# Patient Record
Sex: Male | Born: 1975 | Race: Black or African American | Hispanic: No | Smoking: Current every day smoker
Health system: Southern US, Community
[De-identification: ages and names within clinical notes are randomized; demographics above are authoritative.]

## PROBLEM LIST (undated history)

## (undated) DIAGNOSIS — H4920 Sixth [abducent] nerve palsy, unspecified eye: Secondary | ICD-10-CM

## (undated) DIAGNOSIS — F32A Depression, unspecified: Secondary | ICD-10-CM

## (undated) DIAGNOSIS — F419 Anxiety disorder, unspecified: Secondary | ICD-10-CM

## (undated) DIAGNOSIS — I2699 Other pulmonary embolism without acute cor pulmonale: Secondary | ICD-10-CM

## (undated) DIAGNOSIS — G5600 Carpal tunnel syndrome, unspecified upper limb: Secondary | ICD-10-CM

## (undated) HISTORY — PX: FRACTURE SURGERY: SHX138

---

## 2000-07-25 ENCOUNTER — Encounter: Payer: Self-pay | Admitting: Emergency Medicine

## 2000-07-25 ENCOUNTER — Emergency Department (HOSPITAL_COMMUNITY): Admission: EM | Admit: 2000-07-25 | Discharge: 2000-07-25 | Payer: Self-pay | Admitting: Emergency Medicine

## 2002-08-21 ENCOUNTER — Emergency Department (HOSPITAL_COMMUNITY): Admission: EM | Admit: 2002-08-21 | Discharge: 2002-08-22 | Payer: Self-pay | Admitting: Emergency Medicine

## 2002-08-23 ENCOUNTER — Encounter: Payer: Self-pay | Admitting: Emergency Medicine

## 2002-08-23 ENCOUNTER — Emergency Department (HOSPITAL_COMMUNITY): Admission: EM | Admit: 2002-08-23 | Discharge: 2002-08-23 | Payer: Self-pay | Admitting: Emergency Medicine

## 2005-01-06 ENCOUNTER — Emergency Department (HOSPITAL_COMMUNITY): Admission: EM | Admit: 2005-01-06 | Discharge: 2005-01-06 | Payer: Self-pay | Admitting: Emergency Medicine

## 2005-02-11 ENCOUNTER — Emergency Department (HOSPITAL_COMMUNITY): Admission: EM | Admit: 2005-02-11 | Discharge: 2005-02-11 | Payer: Self-pay | Admitting: Emergency Medicine

## 2005-03-24 ENCOUNTER — Emergency Department (HOSPITAL_COMMUNITY): Admission: EM | Admit: 2005-03-24 | Discharge: 2005-03-24 | Payer: Self-pay | Admitting: Emergency Medicine

## 2008-04-17 ENCOUNTER — Emergency Department (HOSPITAL_COMMUNITY): Admission: EM | Admit: 2008-04-17 | Discharge: 2008-04-17 | Payer: Self-pay | Admitting: Emergency Medicine

## 2010-04-24 LAB — CBC
MCHC: 34.5 g/dL (ref 30.0–36.0)
MCV: 90.9 fL (ref 78.0–100.0)
Platelets: 322 10*3/uL (ref 150–400)
RDW: 13.6 % (ref 11.5–15.5)

## 2010-04-24 LAB — BASIC METABOLIC PANEL
BUN: 12 mg/dL (ref 6–23)
CO2: 22 mEq/L (ref 19–32)
Calcium: 8.4 mg/dL (ref 8.4–10.5)
Chloride: 105 mEq/L (ref 96–112)
Creatinine, Ser: 0.89 mg/dL (ref 0.4–1.5)
GFR calc Af Amer: >60 mL/min (ref >60)
GFR calc non Af Amer: >60 mL/min (ref >60)
Glucose, Bld: 99 mg/dL (ref 70–99)
Potassium: 3.2 mEq/L — ABNORMAL LOW (ref 3.5–5.1)
Sodium: 134 mEq/L — ABNORMAL LOW (ref 135–145)

## 2010-04-24 LAB — DIFFERENTIAL
Basophils Relative: 0 % (ref 0–1)
Eosinophils Absolute: 0 10*3/uL (ref 0.0–0.7)
Monocytes Relative: 2 % — ABNORMAL LOW (ref 3–12)
Neutrophils Relative %: 89 % — ABNORMAL HIGH (ref 43–77)

## 2011-05-14 HISTORY — PX: TOOTH EXTRACTION: SUR596

## 2011-06-11 ENCOUNTER — Emergency Department (HOSPITAL_COMMUNITY): Payer: Self-pay

## 2011-06-11 ENCOUNTER — Emergency Department (HOSPITAL_COMMUNITY)
Admission: EM | Admit: 2011-06-11 | Discharge: 2011-06-12 | Disposition: A | Discharge status: Home / Self Care | Payer: Self-pay | Attending: Emergency Medicine | Admitting: Emergency Medicine

## 2011-06-11 ENCOUNTER — Encounter (HOSPITAL_COMMUNITY): Payer: Self-pay | Admitting: *Deleted

## 2011-06-11 DIAGNOSIS — J019 Acute sinusitis, unspecified: Secondary | ICD-10-CM | POA: Insufficient documentation

## 2011-06-11 DIAGNOSIS — R51 Headache: Secondary | ICD-10-CM | POA: Insufficient documentation

## 2011-06-11 DIAGNOSIS — F17200 Nicotine dependence, unspecified, uncomplicated: Secondary | ICD-10-CM | POA: Insufficient documentation

## 2011-06-11 MED ORDER — IBUPROFEN 800 MG PO TABS
800.0000 mg | ORAL_TABLET | Freq: Once | ORAL | Status: AC
Start: 2011-06-11 — End: 2011-06-11
  Administered 2011-06-11: 800 mg via ORAL
  Filled 2011-06-11: qty 1

## 2011-06-11 MED ORDER — HYDROMORPHONE HCL PF 1 MG/ML IJ SOLN
1.0000 mg | Freq: Once | INTRAMUSCULAR | Status: AC
  Administered 2011-06-11: 1 mg via INTRAMUSCULAR
  Filled 2011-06-11: qty 1

## 2011-06-11 MED ORDER — ONDANSETRON 4 MG PO TBDP
4.0000 mg | ORAL_TABLET | Freq: Once | ORAL | Status: AC
  Administered 2011-06-11: 4 mg via ORAL
  Filled 2011-06-11: qty 1

## 2011-06-11 NOTE — ED Provider Notes (Signed)
History     CSN: [id number]  Arrival date & time 06/11/11  2120   First MD Initiated Contact with Patient 06/11/11 2256      Chief Complaint  Patient presents with  . Migraine    (Consider location/radiation/quality/duration/timing/severity/associated sxs/prior treatment) HPI Comments: R temporal headache.  No trauma.  No n/v.  No earache, sore throat or sinus sxs.  No h/o MHA.  Patient is a 36 y.o. male presenting with migraine. The history is provided by the patient. No language interpreter was used.  Migraine This is a new problem. Episode onset: 6 days ago. The problem occurs constantly. The problem has been unchanged. Associated symptoms include headaches. Pertinent negatives include no chills, coughing, fever, rash, sore throat, swollen glands or weakness. The symptoms are aggravated by nothing. He has tried NSAIDs for the symptoms. The treatment provided mild ("dulls the pain but it comes back".) relief.    Past Medical History  Diagnosis Date  . Migraine     History reviewed. No pertinent past surgical history.  History reviewed. No pertinent family history.  History  Substance Use Topics  . Smoking status: Current Everyday Smoker    Types: Cigars  . Smokeless tobacco: Not on file  . Alcohol Use: Yes      Review of Systems  Constitutional: Negative for fever and chills.  HENT: Negative for sore throat and neck stiffness.   Respiratory: Negative for cough.   Skin: Negative for rash.  Neurological: Positive for headaches. Negative for seizures, syncope, facial asymmetry, speech difficulty and weakness.  All other systems reviewed and are negative.    Allergies  Bee venom  Home Medications   Current Outpatient Rx  Name Route Sig Dispense Refill  . IBUPROFEN 200 MG PO TABS Oral Take 800 mg by mouth as needed. For pain    . AMOXICILLIN-POT CLAVULANATE 875-125 MG PO TABS Oral Take 1 tablet by mouth every 12 (twelve) hours. 14 tablet 0  .  HYDROCODONE-ACETAMINOPHEN 5-325 MG PO TABS  One tab po q 4-6 hrs prn pain 20 tablet 0    BP 126/77  Pulse 73  Temp(Src) 97.7 F (36.5 C) (Oral)  Resp 20  Ht 6\' 3"  (1.905 m)  Wt 165 lb (74.844 kg)  BMI 20.62 kg/m2  SpO2 98%  Physical Exam  Nursing note and vitals reviewed. Constitutional: He is oriented to person, place, and time. He appears well-developed and well-nourished.  HENT:  Head: Normocephalic and atraumatic.  Right Ear: Hearing, tympanic membrane, external ear and ear canal normal.  Left Ear: Hearing, tympanic membrane, external ear and ear canal normal.       No sinus pain or PT  Eyes: EOM are normal. Pupils are equal, round, and reactive to light.  Neck: Trachea normal, normal range of motion and phonation normal. No spinous process tenderness and no muscular tenderness present. Carotid bruit is not present. No rigidity. Normal range of motion present.  Cardiovascular: Normal rate, regular rhythm, normal heart sounds and intact distal pulses.   Pulmonary/Chest: Effort normal and breath sounds normal. No respiratory distress.  Abdominal: Soft. He exhibits no distension. There is no tenderness.  Musculoskeletal: Normal range of motion.  Neurological: He is alert and oriented to person, place, and time. He has normal strength. He displays normal reflexes. No cranial nerve deficit or sensory deficit. He displays a negative Romberg sign. Coordination and gait normal. GCS eye subscore is 4. GCS verbal subscore is 5. GCS motor subscore is 6.  Reflex Scores:  Bicep reflexes are 2+ on the right side and 2+ on the left side.      Brachioradialis reflexes are 2+ on the right side and 2+ on the left side.      Patellar reflexes are 2+ on the right side and 2+ on the left side.      Achilles reflexes are 2+ on the right side and 2+ on the left side. Skin: Skin is warm and dry.  Psychiatric: He has a normal mood and affect. Judgment normal.    ED Course  Procedures (including  critical care time)  Labs Reviewed - No data to display Ct Head Wo Contrast  06/11/2011  *RADIOLOGY REPORT*  Clinical Data: Right-sided headache for 6 days  CT HEAD WITHOUT CONTRAST  Technique:  Contiguous axial images were obtained from the base of the skull through the vertex without contrast.  Comparison: None.  Findings: There is no evidence for acute infarction, intracranial hemorrhage, mass lesion, hydrocephalus, or extra-axial fluid. There is no atrophy or white matter disease.  The calvarium is intact.  Mild chronic sinus disease is evident.  There is no acute mastoid fluid.  IMPRESSION: Negative exam.  Original Report Authenticated By: Elsie Stain, M.D.     1. Headache   2. Sinusitis, acute       MDM  No focal neurologic sxs.  Will tx sinusitis.  Pt is to return to ED if sxs change or worsen significantly.        Worthy Rancher, PA 06/12/11 [phone]

## 2011-06-11 NOTE — ED Notes (Signed)
Pt reporting intermittent headache X5 days.  Reports pain worse on right side of head.  Denies any nausea, vomiting, dizziness or visual problems.  Pt reports that he has been taking 800 mg of Ibuprofen 2-3 times per day with minor effectiveness.

## 2011-06-11 NOTE — ED Notes (Signed)
Migraine HA since Friday, has taken Ibuprofen with some relief but returns

## 2011-06-12 MED ORDER — AMOXICILLIN-POT CLAVULANATE 875-125 MG PO TABS: 1.0000 | ORAL_TABLET | Freq: Two times a day (BID) | ORAL | Status: AC

## 2011-06-12 MED ORDER — AMOXICILLIN-POT CLAVULANATE 875-125 MG PO TABS
1.0000 | ORAL_TABLET | Freq: Once | ORAL | Status: AC
  Administered 2011-06-12: 1 via ORAL
  Filled 2011-06-12: qty 1

## 2011-06-12 MED ORDER — HYDROCODONE-ACETAMINOPHEN 5-325 MG PO TABS: ORAL_TABLET | ORAL | Status: DC

## 2011-06-12 NOTE — Discharge Instructions (Signed)
Headaches, Frequently Asked Questions MIGRAINE HEADACHES Q: What is migraine? What causes it? How can I treat it? A: Generally, migraine headaches begin as a dull ache. Then they develop into a constant, throbbing, and pulsating pain. You may experience pain at the temples. You may experience pain at the front or back of one or both sides of the head. The pain is usually accompanied by a combination of:  Nausea.   Vomiting.   Sensitivity to light and noise.  Some people (about 15%) experience an aura (see below) before an attack. The cause of migraine is believed to be chemical reactions in the brain. Treatment for migraine may include over-the-counter or prescription medications. It may also include self-help techniques. These include relaxation training and biofeedback.  Q: What is an aura? A: About 15% of people with migraine get an "aura". This is a sign of neurological symptoms that occur before a migraine headache. You may see wavy or jagged lines, dots, or flashing lights. You might experience tunnel vision or blind spots in one or both eyes. The aura can include visual or auditory hallucinations (something imagined). It may include disruptions in smell (such as strange odors), taste or touch. Other symptoms include:  Numbness.   A "pins and needles" sensation.   Difficulty in recalling or speaking the correct word.  These neurological events may last as long as 60 minutes. These symptoms will fade as the headache begins. Q: What is a trigger? A: Certain physical or environmental factors can lead to or "trigger" a migraine. These include:  Foods.   Hormonal changes.   Weather.   Stress.  It is important to remember that triggers are different for everyone. To help prevent migraine attacks, you need to figure out which triggers affect you. Keep a headache diary. This is a good way to track triggers. The diary will help you talk to your healthcare professional about your  condition. Q: Does weather affect migraines? A: Bright sunshine, hot, humid conditions, and drastic changes in barometric pressure may lead to, or "trigger," a migraine attack in some people. But studies have shown that weather does not act as a trigger for everyone with migraines. Q: What is the link between migraine and hormones? A: Hormones start and regulate many of your body's functions. Hormones keep your body in balance within a constantly changing environment. The levels of hormones in your body are unbalanced at times. Examples are during menstruation, pregnancy, or menopause. That can lead to a migraine attack. In fact, about three quarters of all women with migraine report that their attacks are related to the menstrual cycle.  Q: Is there an increased risk of stroke for migraine sufferers? A: The likelihood of a migraine attack causing a stroke is very remote. That is not to say that migraine sufferers cannot have a stroke associated with their migraines. In persons under age 40, the most common associated factor for stroke is migraine headache. But over the course of a person's normal life span, the occurrence of migraine headache may actually be associated with a reduced risk of dying from cerebrovascular disease due to stroke.  Q: What are acute medications for migraine? A: Acute medications are used to treat the pain of the headache after it has started. Examples over-the-counter medications, NSAIDs, ergots, and triptans.  Q: What are the triptans? A: Triptans are the newest class of abortive medications. They are specifically targeted to treat migraine. Triptans are vasoconstrictors. They moderate some chemical reactions in the brain.   The triptans work on receptors in your brain. Triptans help to restore the balance of a neurotransmitter called serotonin. Fluctuations in levels of serotonin are thought to be a main cause of migraine.  Q: Are over-the-counter medications for migraine  effective? A: Over-the-counter, or "OTC," medications may be effective in relieving mild to moderate pain and associated symptoms of migraine. But you should see your caregiver before beginning any treatment regimen for migraine.  Q: What are preventive medications for migraine? A: Preventive medications for migraine are sometimes referred to as "prophylactic" treatments. They are used to reduce the frequency, severity, and length of migraine attacks. Examples of preventive medications include antiepileptic medications, antidepressants, beta-blockers, calcium channel blockers, and NSAIDs (nonsteroidal anti-inflammatory drugs). Q: Why are anticonvulsants used to treat migraine? A: During the past few years, there has been an increased interest in antiepileptic drugs for the prevention of migraine. They are sometimes referred to as "anticonvulsants". Both epilepsy and migraine may be caused by similar reactions in the brain.  Q: Why are antidepressants used to treat migraine? A: Antidepressants are typically used to treat people with depression. They may reduce migraine frequency by regulating chemical levels, such as serotonin, in the brain.  Q: What alternative therapies are used to treat migraine? A: The term "alternative therapies" is often used to describe treatments considered outside the scope of conventional Western medicine. Examples of alternative therapy include acupuncture, acupressure, and yoga. Another common alternative treatment is herbal therapy. Some herbs are believed to relieve headache pain. Always discuss alternative therapies with your caregiver before proceeding. Some herbal products contain arsenic and other toxins. TENSION HEADACHES Q: What is a tension-type headache? What causes it? How can I treat it? A: Tension-type headaches occur randomly. They are often the result of temporary stress, anxiety, fatigue, or anger. Symptoms include soreness in your temples, a tightening  band-like sensation around your head (a "vice-like" ache). Symptoms can also include a pulling feeling, pressure sensations, and contracting head and neck muscles. The headache begins in your forehead, temples, or the back of your head and neck. Treatment for tension-type headache may include over-the-counter or prescription medications. Treatment may also include self-help techniques such as relaxation training and biofeedback. CLUSTER HEADACHES Q: What is a cluster headache? What causes it? How can I treat it? A: Cluster headache gets its name because the attacks come in groups. The pain arrives with little, if any, warning. It is usually on one side of the head. A tearing or bloodshot eye and a runny nose on the same side of the headache may also accompany the pain. Cluster headaches are believed to be caused by chemical reactions in the brain. They have been described as the most severe and intense of any headache type. Treatment for cluster headache includes prescription medication and oxygen. SINUS HEADACHES Q: What is a sinus headache? What causes it? How can I treat it? A: When a cavity in the bones of the face and skull (a sinus) becomes inflamed, the inflammation will cause localized pain. This condition is usually the result of an allergic reaction, a tumor, or an infection. If your headache is caused by a sinus blockage, such as an infection, you will probably have a fever. An x-ray will confirm a sinus blockage. Your caregiver's treatment might include antibiotics for the infection, as well as antihistamines or decongestants.  REBOUND HEADACHES Q: What is a rebound headache? What causes it? How can I treat it? A: A pattern of taking acute headache medications too   often can lead to a condition known as "rebound headache." A pattern of taking too much headache medication includes taking it more than 2 days per week or in excessive amounts. That means more than the label or a caregiver advises.  With rebound headaches, your medications not only stop relieving pain, they actually begin to cause headaches. Doctors treat rebound headache by tapering the medication that is being overused. Sometimes your caregiver will gradually substitute a different type of treatment or medication. Stopping may be a challenge. Regularly overusing a medication increases the potential for serious side effects. Consult a caregiver if you regularly use headache medications more than 2 days per week or more than the label advises. ADDITIONAL QUESTIONS AND ANSWERS Q: What is biofeedback? A: Biofeedback is a self-help treatment. Biofeedback uses special equipment to monitor your body's involuntary physical responses. Biofeedback monitors:  Breathing.   Pulse.   Heart rate.   Temperature.   Muscle tension.   Brain activity.  Biofeedback helps you refine and perfect your relaxation exercises. You learn to control the physical responses that are related to stress. Once the technique has been mastered, you do not need the equipment any more. Q: Are headaches hereditary? A: Four out of five (80%) of people that suffer report a family history of migraine. Scientists are not sure if this is genetic or a family predisposition. Despite the uncertainty, a child has a 50% chance of having migraine if one parent suffers. The child has a 75% chance if both parents suffer.  Q: Can children get headaches? A: By the time they reach high school, most young people have experienced some type of headache. Many safe and effective approaches or medications can prevent a headache from occurring or stop it after it has begun.  Q: What type of doctor should I see to diagnose and treat my headache? A: Start with your primary caregiver. Discuss his or her experience and approach to headaches. Discuss methods of classification, diagnosis, and treatment. Your caregiver may decide to recommend you to a headache specialist, depending upon  your symptoms or other physical conditions. Having diabetes, allergies, etc., may require a more comprehensive and inclusive approach to your headache. The National [employer] will provide, upon request, a list of [facility] physician members in your state. Document Released: 03/22/2003 Document Revised: 12/19/2010 Document Reviewed: 08/30/2007 [facility] Patient Information 2012 [place], [place].Sinusitis Sinuses are air pockets within the bones of your face. The growth of bacteria within a sinus leads to infection. The infection prevents the sinuses from draining. This infection is called sinusitis. SYMPTOMS  There will be different areas of pain depending on which sinuses have become infected.  The maxillary sinuses often produce pain beneath the eyes.   Frontal sinusitis may cause pain in the middle of the forehead and above the eyes.  Other problems (symptoms) include:  Toothaches.   Colored, pus-like (purulent) drainage from the nose.   Swelling, warmth, and tenderness over the sinus areas may be signs of infection.  TREATMENT  Sinusitis is most often determined by an exam.X-rays may be taken. If x-rays have been taken, make sure you obtain your results or find out how you are to obtain them. Your caregiver may give you medications (antibiotics). These are medications that will help kill the bacteria causing the infection. You may also be given a medication (decongestant) that helps to reduce sinus swelling.  HOME CARE INSTRUCTIONS   Only take over-the-counter or prescription medicines for pain, discomfort, or fever as directed  by your caregiver.   Drink extra fluids. Fluids help thin the mucus so your sinuses can drain more easily.   Applying either moist heat or ice packs to the sinus areas may help relieve discomfort.   Use saline nasal sprays to help moisten your sinuses. The sprays can be found at your local drugstore.  SEEK IMMEDIATE MEDICAL CARE IF:  You have a fever.    You have increasing pain, severe headaches, or toothache.   You have nausea, vomiting, or drowsiness.   You develop unusual swelling around the face or trouble seeing.  MAKE SURE YOU:   Understand these instructions.   Will watch your condition.   Will get help right away if you are not doing well or get worse.  Document Released: 12/30/2004 Document Revised: 12/19/2010 Document Reviewed: 07/29/2006 [facility] Patient Information 2012 [place], [place].   The radiologist is identifying a mild sinusitis.  i can't confirm that this is the underlying cause of your headache but we will treat and see if the headache improves.

## 2011-06-12 NOTE — ED Provider Notes (Signed)
Medical screening examination/treatment/procedure(s) were performed by non-physician practitioner and as supervising physician I was immediately available for consultation/collaboration.   Joya Gaskins, MD 06/12/11 [phone]

## 2011-06-24 ENCOUNTER — Encounter (HOSPITAL_COMMUNITY): Payer: Self-pay | Admitting: *Deleted

## 2011-06-24 ENCOUNTER — Inpatient Hospital Stay (HOSPITAL_COMMUNITY)
Admission: EM | Admit: 2011-06-24 | Discharge: 2011-06-27 | DRG: 123 | Disposition: A | Discharge status: Home / Self Care | Payer: MEDICAID | Attending: Internal Medicine | Admitting: Internal Medicine

## 2011-06-24 ENCOUNTER — Emergency Department (HOSPITAL_COMMUNITY): Payer: Self-pay

## 2011-06-24 DIAGNOSIS — R519 Headache, unspecified: Secondary | ICD-10-CM | POA: Diagnosis present

## 2011-06-24 DIAGNOSIS — F17200 Nicotine dependence, unspecified, uncomplicated: Secondary | ICD-10-CM | POA: Diagnosis present

## 2011-06-24 DIAGNOSIS — H4920 Sixth [abducent] nerve palsy, unspecified eye: Secondary | ICD-10-CM

## 2011-06-24 DIAGNOSIS — G509 Disorder of trigeminal nerve, unspecified: Secondary | ICD-10-CM | POA: Diagnosis present

## 2011-06-24 DIAGNOSIS — R51 Headache: Secondary | ICD-10-CM

## 2011-06-24 LAB — COMPREHENSIVE METABOLIC PANEL
ALT: 8 U/L (ref 0–53)
AST: 13 U/L (ref 0–37)
Albumin: 3.8 g/dL (ref 3.5–5.2)
Alkaline Phosphatase: 114 U/L (ref 39–117)
Glucose, Bld: 123 mg/dL — ABNORMAL HIGH (ref 70–99)
Potassium: 3.9 mEq/L (ref 3.5–5.1)
Sodium: 139 mEq/L (ref 135–145)
Total Protein: 7.7 g/dL (ref 6.0–8.3)

## 2011-06-24 LAB — DIFFERENTIAL
Eosinophils Absolute: 0.2 10*3/uL (ref 0.0–0.7)
Lymphs Abs: 2.2 10*3/uL (ref 0.7–4.0)
Neutrophils Relative %: 53 % (ref 43–77)

## 2011-06-24 LAB — CBC
MCH: 31.3 pg (ref 26.0–34.0)
Platelets: 327 10*3/uL (ref 150–400)
RBC: 5.02 MIL/uL (ref 4.22–5.81)
WBC: 5.8 10*3/uL (ref 4.0–10.5)

## 2011-06-24 NOTE — ED Provider Notes (Cosign Needed)
History     CSN: [id number]  Arrival date & time 06/24/11  1859   First MD Initiated Contact with Patient 06/24/11 2214      Chief Complaint  Patient presents with  . Eye Problem    (Consider location/radiation/quality/duration/timing/severity/associated sxs/prior treatment) The history is provided by the patient. No language interpreter was used.   Colton Sanders is a 36 y.o. male who presents to the Emergency Department complaining of double vision especially when he looks straight ahead and to the right onset 6 days ago with associated numbness on the right side of the face and HA.  Patient says that he has been experiencing numbness and headaches for 2 weeks. Patient describes his HA pain as throbbing and pulsating. Patient said he was taking Ibuprofen for his headache, but it would only dull the HA. Patient said he was seen here in the ED for his headache and was prescribed Hydrocodone.  Patient also had a tooth extraction recently and was told his facial pain and numbness was from the abscessed tooth and his been on antibiotics. He denies fever, numbness or weakness in his extremities.  Patient is a current everyday smoker. Patient says he smokes 2-3 Black and Mild cigarettes every day. Patient says he drinks 2-3 beers every week. 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 Patient has a family history of HTN and diabetes (mother).  PCP none  Past Medical History  Diagnosis Date  . Migraine     History reviewed. No pertinent past surgical history.  History reviewed. No pertinent family history. HTN AODM  History  Substance Use Topics  . Smoking status: Current Everyday Smoker    Types: Cigars  . Smokeless tobacco: Not on file  . Alcohol Use: Yes   unemployed   Review of Systems A complete 10 system review of systems was obtained and all systems are negative except as noted in the HPI and PMH.   Allergies  Bee venom  Home  Medications   Current Outpatient Rx  Name Route Sig Dispense Refill  . IBUPROFEN 200 MG PO TABS Oral Take 800 mg by mouth as needed. For pain    . PENICILLIN V POTASSIUM 500 MG PO TABS Oral Take 500 mg by mouth 4 (four) times daily.      BP 128/79  Pulse 58  Temp(Src) 98.1 F (36.7 C) (Oral)  Resp 18  Ht 6\' 3"  (1.905 m)  Wt 165 lb (74.844 kg)  BMI 20.62 kg/m2  SpO2 100%  Vital signs normal    Physical Exam  Nursing note and vitals reviewed. Constitutional: He is oriented to person, place, and time. He appears well-developed and well-nourished.  HENT:  Head: Normocephalic and atraumatic.  Right Ear: External ear normal.  Left Ear: External ear normal.  Nose: Nose normal.  Mouth/Throat: Oropharynx is clear and moist.  Eyes: Conjunctivae and EOM are normal. Pupils are equal, round, and reactive to light. Pupils are equal.       Right 6th nerve palsy.  Neck: Normal range of motion. Neck supple.  Cardiovascular: Normal rate and regular rhythm.   Pulmonary/Chest: Effort normal and breath sounds normal.  Abdominal: Soft. Bowel sounds are normal.  Musculoskeletal: Normal range of motion.  Neurological: He is alert and oriented to person, place, and time. No cranial nerve deficit.       No focal weakness noted  Skin: Skin is warm and dry.  Psychiatric: He has a normal mood and  affect. His behavior is normal.    ED Course  Procedures (including critical care time) DIAGNOSTIC STUDIES: Oxygen Saturation is 100% on room air, normal by my interpretation.    COORDINATION OF CARE: 10:37PM- Patient informed of current plan for treatment and evaluation and agrees with plan at this time. Will order blood work and consult with a neurologist.   23:21 D/W Dr Marjory Lies, Neurology, feels needs repeat head CT since prior one done before his palsy and admit for MR and further evaluation  23:31 Dr Rito Ehrlich will admit.   Results for orders placed during the hospital encounter of 06/24/11    CBC      Component Value Range   WBC 5.8  4.0 - 10.5 (K/uL)   RBC 5.02  4.22 - 5.81 (MIL/uL)   Hemoglobin 15.7  13.0 - 17.0 (g/dL)   HCT 40.9  81.1 - 91.4 (%)   MCV 92.8  78.0 - 100.0 (fL)   MCH 31.3  26.0 - 34.0 (pg)   MCHC 33.7  30.0 - 36.0 (g/dL)   RDW 78.2  95.6 - 21.3 (%)   Platelets 327  150 - 400 (K/uL)  DIFFERENTIAL      Component Value Range   Neutrophils Relative 53  43 - 77 (%)   Neutro Abs 3.1  1.7 - 7.7 (K/uL)   Lymphocytes Relative 37  12 - 46 (%)   Lymphs Abs 2.2  0.7 - 4.0 (K/uL)   Monocytes Relative 6  3 - 12 (%)   Monocytes Absolute 0.3  0.1 - 1.0 (K/uL)   Eosinophils Relative 3  0 - 5 (%)   Eosinophils Absolute 0.2  0.0 - 0.7 (K/uL)   Basophils Relative 1  0 - 1 (%)   Basophils Absolute 0.1  0.0 - 0.1 (K/uL)  COMPREHENSIVE METABOLIC PANEL      Component Value Range   Sodium 139  135 - 145 (mEq/L)   Potassium 3.9  3.5 - 5.1 (mEq/L)   Chloride 101  96 - 112 (mEq/L)   CO2 29  19 - 32 (mEq/L)   Glucose, Bld 123 (*) 70 - 99 (mg/dL)   BUN 11  6 - 23 (mg/dL)   Creatinine, Ser 0.86  0.50 - 1.35 (mg/dL)   Calcium 57.8  8.4 - 10.5 (mg/dL)   Total Protein 7.7  6.0 - 8.3 (g/dL)   Albumin 3.8  3.5 - 5.2 (g/dL)   AST 13  0 - 37 (U/L)   ALT 8  0 - 53 (U/L)   Alkaline Phosphatase 114  39 - 117 (U/L)   Total Bilirubin 0.3  0.3 - 1.2 (mg/dL)   GFR calc non Af Amer >90  >90 (mL/min)   GFR calc Af Amer >90  >90 (mL/min)  APTT      Component Value Range   aPTT 27  24 - 37 (seconds)  PROTIME-INR      Component Value Range   Prothrombin Time 13.2  11.6 - 15.2 (seconds)   INR 0.98  0.00 - 1.49    Laboratory interpretation all normal   Ct Head Wo Contrast  06/11/2011  *RADIOLOGY REPORT*  Clinical Data: Right-sided headache for 6 days  CT HEAD WITHOUT CONTRAST  Technique:  Contiguous axial images were obtained from the base of the skull through the vertex without contrast.  Comparison: None.  Findings: There is no evidence for acute infarction, intracranial hemorrhage,  mass lesion, hydrocephalus, or extra-axial fluid. There is no atrophy or white matter disease.  The  calvarium is intact.  Mild chronic sinus disease is evident.  There is no acute mastoid fluid.  IMPRESSION: Negative exam.  Original Report Authenticated By: Elsie Stain, M.D.    Ct Head Wo Contrast  06/25/2011  *RADIOLOGY REPORT*  Clinical Data: New right facial nerve palsy.  CT HEAD WITHOUT CONTRAST  Technique:  Contiguous axial images were obtained from the base of the skull through the vertex without contrast.  Comparison: 06/11/2011  Findings: There is no evidence for acute hemorrhage, hydrocephalus, mass lesion, or abnormal extra-axial fluid collection.  No definite CT evidence for acute infarction.  The visualized paranasal sinuses and mastoid air cells are predominately clear.  Right maxillary sinus mucous retention cyst versus polyp and minimal ethmoid air cell opacification.  IMPRESSION: No acute intracranial abnormality.  Original Report Authenticated By: Waneta Martins, M.D.      1. Sixth cranial nerve palsy    Plan admission   MDM   I personally performed the services described in this documentation, which was scribed in my presence. The recorded information has been reviewed and considered.  Devoria Albe, MD, FACEP    Ward Givens, MD 06/24/11 2356  Ward Givens, MD 06/25/11 7846  Ward Givens, MD 06/25/11 0145

## 2011-06-24 NOTE — ED Notes (Signed)
Blurred vision, seeing double for over 1 week.  Seen here for headache.  And thought was sinus infection . Has had tooth extraction.  Cont to have blurred vision. And headache.No appetite, No nausea.

## 2011-06-25 ENCOUNTER — Encounter (HOSPITAL_COMMUNITY): Payer: Self-pay | Admitting: Internal Medicine

## 2011-06-25 ENCOUNTER — Inpatient Hospital Stay (HOSPITAL_COMMUNITY): Payer: Self-pay

## 2011-06-25 DIAGNOSIS — R51 Headache: Secondary | ICD-10-CM

## 2011-06-25 DIAGNOSIS — H4920 Sixth [abducent] nerve palsy, unspecified eye: Secondary | ICD-10-CM

## 2011-06-25 DIAGNOSIS — R519 Headache, unspecified: Secondary | ICD-10-CM | POA: Diagnosis present

## 2011-06-25 DIAGNOSIS — G509 Disorder of trigeminal nerve, unspecified: Secondary | ICD-10-CM | POA: Diagnosis present

## 2011-06-25 LAB — COMPREHENSIVE METABOLIC PANEL
ALT: 6 U/L (ref 0–53)
AST: 12 U/L (ref 0–37)
Albumin: 3.3 g/dL — ABNORMAL LOW (ref 3.5–5.2)
CO2: 26 mEq/L (ref 19–32)
Calcium: 9.4 mg/dL (ref 8.4–10.5)
Creatinine, Ser: 0.84 mg/dL (ref 0.50–1.35)
GFR calc non Af Amer: >90 mL/min (ref >90)
Sodium: 138 mEq/L (ref 135–145)
Total Protein: 6.9 g/dL (ref 6.0–8.3)

## 2011-06-25 LAB — SEDIMENTATION RATE: Sed Rate: 5 mm/hr (ref 0–16)

## 2011-06-25 LAB — CBC
HCT: 44.7 % (ref 39.0–52.0)
Hemoglobin: 15 g/dL (ref 13.0–17.0)
MCH: 30.9 pg (ref 26.0–34.0)
MCHC: 33.6 g/dL (ref 30.0–36.0)
MCV: 92.2 fL (ref 78.0–100.0)
RBC: 4.85 MIL/uL (ref 4.22–5.81)

## 2011-06-25 LAB — RAPID URINE DRUG SCREEN, HOSP PERFORMED
Barbiturates: NOT DETECTED
Benzodiazepines: NOT DETECTED
Cocaine: NOT DETECTED
Tetrahydrocannabinol: NOT DETECTED

## 2011-06-25 LAB — HOMOCYSTEINE: Homocysteine: 8.6 umol/L (ref 4.0–15.4)

## 2011-06-25 LAB — POCT I-STAT TROPONIN I: Troponin i, poc: 0 ng/mL (ref 0.00–0.08)

## 2011-06-25 LAB — TSH: TSH: 0.44 u[IU]/mL (ref 0.350–4.500)

## 2011-06-25 MED ORDER — GADOBENATE DIMEGLUMINE 529 MG/ML IV SOLN
15.0000 mL | Freq: Once | INTRAVENOUS | Status: AC | PRN
  Administered 2011-06-25: 15 mL via INTRAVENOUS

## 2011-06-25 MED ORDER — ALBUTEROL SULFATE (5 MG/ML) 0.5% IN NEBU: 2.5000 mg | INHALATION_SOLUTION | RESPIRATORY_TRACT | Status: DC | PRN

## 2011-06-25 MED ORDER — SODIUM CHLORIDE 0.9 % IJ SOLN
3.0000 mL | Freq: Two times a day (BID) | INTRAMUSCULAR | Status: DC
  Administered 2011-06-25 – 2011-06-26 (×4): 3 mL via INTRAVENOUS
  Filled 2011-06-25 (×5): qty 3

## 2011-06-25 MED ORDER — ONDANSETRON HCL 4 MG/2ML IJ SOLN: 4.0000 mg | Freq: Four times a day (QID) | INTRAMUSCULAR | Status: DC | PRN

## 2011-06-25 MED ORDER — ASPIRIN EC 325 MG PO TBEC
325.0000 mg | DELAYED_RELEASE_TABLET | Freq: Every day | ORAL | Status: DC
  Administered 2011-06-25: 325 mg via ORAL
  Filled 2011-06-25: qty 1

## 2011-06-25 MED ORDER — ACETAMINOPHEN 650 MG RE SUPP: 650.0000 mg | Freq: Four times a day (QID) | RECTAL | Status: DC | PRN

## 2011-06-25 MED ORDER — PENICILLIN V POTASSIUM 250 MG PO TABS
500.0000 mg | ORAL_TABLET | Freq: Four times a day (QID) | ORAL | Status: AC
  Administered 2011-06-25 – 2011-06-26 (×8): 500 mg via ORAL
  Filled 2011-06-25 (×8): qty 2

## 2011-06-25 MED ORDER — ACETAMINOPHEN 325 MG PO TABS
ORAL_TABLET | ORAL | Status: AC
  Administered 2011-06-25: 650 mg
  Filled 2011-06-25: qty 2

## 2011-06-25 MED ORDER — HYDROCODONE-ACETAMINOPHEN 5-325 MG PO TABS
1.0000 | ORAL_TABLET | Freq: Once | ORAL | Status: AC
  Administered 2011-06-25: 1 via ORAL
  Filled 2011-06-25: qty 1

## 2011-06-25 MED ORDER — ONDANSETRON HCL 4 MG PO TABS: 4.0000 mg | ORAL_TABLET | Freq: Four times a day (QID) | ORAL | Status: DC | PRN

## 2011-06-25 MED ORDER — ENOXAPARIN SODIUM 40 MG/0.4ML ~~LOC~~ SOLN
40.0000 mg | SUBCUTANEOUS | Status: DC
Start: 2011-06-25 — End: 2011-06-25
  Administered 2011-06-25: 40 mg via SUBCUTANEOUS
  Filled 2011-06-25: qty 0.4

## 2011-06-25 MED ORDER — ACETAMINOPHEN 325 MG PO TABS
650.0000 mg | ORAL_TABLET | Freq: Four times a day (QID) | ORAL | Status: DC | PRN
  Administered 2011-06-25: 650 mg via ORAL
  Filled 2011-06-25: qty 2

## 2011-06-25 MED ORDER — HYDROCODONE-ACETAMINOPHEN 5-325 MG PO TABS
1.0000 | ORAL_TABLET | Freq: Four times a day (QID) | ORAL | Status: DC | PRN
  Administered 2011-06-25 – 2011-06-27 (×6): 1 via ORAL
  Filled 2011-06-25 (×6): qty 1

## 2011-06-25 MED ORDER — DOCUSATE SODIUM 100 MG PO CAPS
100.0000 mg | ORAL_CAPSULE | Freq: Two times a day (BID) | ORAL | Status: DC
  Administered 2011-06-25: 100 mg via ORAL
  Filled 2011-06-25 (×4): qty 1

## 2011-06-25 NOTE — Consult Note (Signed)
Reason for Consult: Referring Physician:   MOLLY MASELLI is an 36 y.o. male.  HPI:   Past Medical History  Diagnosis Date  . Migraine     Past Surgical History  Procedure Date  . Tooth extraction 05/2011    History reviewed. No pertinent family history.  Social History:  reports that he has been smoking Cigars.  He does not have any smokeless tobacco history on file. He reports that he drinks about 1.8 ounces of alcohol per week. He reports that he does not use illicit drugs.  Allergies:  Allergies  Allergen Reactions  . Bee Venom Hives and Swelling    Medications:  Prior to Admission medications   Medication Sig Start Date End Date Taking? Authorizing Provider  ibuprofen (ADVIL,MOTRIN) 200 MG tablet Take 800 mg by mouth as needed. For pain   Yes Historical Provider, MD  penicillin v potassium (VEETID) 500 MG tablet Take 500 mg by mouth 4 (four) times daily. 06/19/11 06/25/11 Yes Historical Provider, MD   Scheduled Meds:   . acetaminophen      . aspirin EC  325 mg Oral Daily  . docusate sodium  100 mg Oral BID  . enoxaparin  40 mg Subcutaneous Q24H  . HYDROcodone-acetaminophen  1 tablet Oral Once  . penicillin v potassium  500 mg Oral QID  . sodium chloride  3 mL Intravenous Q12H   Continuous Infusions:  PRN Meds:.acetaminophen, acetaminophen, albuterol, ondansetron (ZOFRAN) IV, ondansetron   Results for orders placed during the hospital encounter of 06/24/11 (from the past 48 hour(s))  CBC     Status: Normal   Collection Time   06/24/11 10:49 PM      Component Value Range Comment   WBC 5.8  4.0 - 10.5 K/uL    RBC 5.02  4.22 - 5.81 MIL/uL    Hemoglobin 15.7  13.0 - 17.0 g/dL    HCT 16.1  09.6 - 04.5 %    MCV 92.8  78.0 - 100.0 fL    MCH 31.3  26.0 - 34.0 pg    MCHC 33.7  30.0 - 36.0 g/dL    RDW 40.9  81.1 - 91.4 %    Platelets 327  150 - 400 K/uL   DIFFERENTIAL     Status: Normal   Collection Time   06/24/11 10:49 PM      Component Value Range Comment   Neutrophils Relative 53  43 - 77 %    Neutro Abs 3.1  1.7 - 7.7 K/uL    Lymphocytes Relative 37  12 - 46 %    Lymphs Abs 2.2  0.7 - 4.0 K/uL    Monocytes Relative 6  3 - 12 %    Monocytes Absolute 0.3  0.1 - 1.0 K/uL    Eosinophils Relative 3  0 - 5 %    Eosinophils Absolute 0.2  0.0 - 0.7 K/uL    Basophils Relative 1  0 - 1 %    Basophils Absolute 0.1  0.0 - 0.1 K/uL   COMPREHENSIVE METABOLIC PANEL     Status: Abnormal   Collection Time   06/24/11 10:49 PM      Component Value Range Comment   Sodium 139  135 - 145 mEq/L    Potassium 3.9  3.5 - 5.1 mEq/L    Chloride 101  96 - 112 mEq/L    CO2 29  19 - 32 mEq/L    Glucose, Bld 123 (*) 70 - 99 mg/dL  BUN 11  6 - 23 mg/dL    Creatinine, Ser 4.54  0.50 - 1.35 mg/dL    Calcium 09.8  8.4 - 10.5 mg/dL    Total Protein 7.7  6.0 - 8.3 g/dL    Albumin 3.8  3.5 - 5.2 g/dL    AST 13  0 - 37 U/L    ALT 8  0 - 53 U/L    Alkaline Phosphatase 114  39 - 117 U/L    Total Bilirubin 0.3  0.3 - 1.2 mg/dL    GFR calc non Af Amer >90  >90 mL/min    GFR calc Af Amer >90  >90 mL/min   APTT     Status: Normal   Collection Time   06/24/11 10:49 PM      Component Value Range Comment   aPTT 27  24 - 37 seconds   PROTIME-INR     Status: Normal   Collection Time   06/24/11 10:49 PM      Component Value Range Comment   Prothrombin Time 13.2  11.6 - 15.2 seconds    INR 0.98  0.00 - 1.49   POCT I-STAT TROPONIN I     Status: Normal   Collection Time   06/24/11 11:12 PM      Component Value Range Comment   Troponin i, poc 0.00  0.00 - 0.08 ng/mL    Comment 3            URINE RAPID DRUG SCREEN (HOSP PERFORMED)     Status: Abnormal   Collection Time   06/25/11 12:35 AM      Component Value Range Comment   Opiates NONE DETECTED  NONE DETECTED    Cocaine NONE DETECTED  NONE DETECTED    Benzodiazepines NONE DETECTED  NONE DETECTED    Amphetamines POSITIVE (*) NONE DETECTED RESULT REPEATED AND VERIFIED   Tetrahydrocannabinol NONE DETECTED  NONE DETECTED      Barbiturates NONE DETECTED  NONE DETECTED   COMPREHENSIVE METABOLIC PANEL     Status: Abnormal   Collection Time   06/25/11  6:26 AM      Component Value Range Comment   Sodium 138  135 - 145 mEq/L    Potassium 3.8  3.5 - 5.1 mEq/L    Chloride 103  96 - 112 mEq/L    CO2 26  19 - 32 mEq/L    Glucose, Bld 97  70 - 99 mg/dL    BUN 10  6 - 23 mg/dL    Creatinine, Ser 1.19  0.50 - 1.35 mg/dL    Calcium 9.4  8.4 - 14.7 mg/dL    Total Protein 6.9  6.0 - 8.3 g/dL    Albumin 3.3 (*) 3.5 - 5.2 g/dL    AST 12  0 - 37 U/L    ALT 6  0 - 53 U/L    Alkaline Phosphatase 101  39 - 117 U/L    Total Bilirubin 0.3  0.3 - 1.2 mg/dL    GFR calc non Af Amer >90  >90 mL/min    GFR calc Af Amer >90  >90 mL/min   CBC     Status: Normal   Collection Time   06/25/11  6:26 AM      Component Value Range Comment   WBC 5.2  4.0 - 10.5 K/uL    RBC 4.85  4.22 - 5.81 MIL/uL    Hemoglobin 15.0  13.0 - 17.0 g/dL    HCT 44.7  39.0 - 52.0 %    MCV 92.2  78.0 - 100.0 fL    MCH 30.9  26.0 - 34.0 pg    MCHC 33.6  30.0 - 36.0 g/dL    RDW 14.7  82.9 - 56.2 %    Platelets 346  150 - 400 K/uL     Ct Head Wo Contrast  06/25/2011  *RADIOLOGY REPORT*  Clinical Data: New right facial nerve palsy.  CT HEAD WITHOUT CONTRAST  Technique:  Contiguous axial images were obtained from the base of the skull through the vertex without contrast.  Comparison: 06/11/2011  Findings: There is no evidence for acute hemorrhage, hydrocephalus, mass lesion, or abnormal extra-axial fluid collection.  No definite CT evidence for acute infarction.  The visualized paranasal sinuses and mastoid air cells are predominately clear.  Right maxillary sinus mucous retention cyst versus polyp and minimal ethmoid air cell opacification.  IMPRESSION: No acute intracranial abnormality.  Original Report Authenticated By: Waneta Martins, M.D.    Review of Systems  Constitutional: Negative.   Eyes: Negative.   Respiratory: Negative.   Cardiovascular:  Negative.   Gastrointestinal: Negative.   Genitourinary: Negative.   Musculoskeletal: Negative.   Skin: Negative.   Neurological: Positive for headaches.   Blood pressure 119/72, pulse 53, temperature 98.2 F (36.8 C), temperature source Oral, resp. rate 16, height 6\' 3"  (1.905 m), weight 74.8 kg (164 lb 14.5 oz), SpO2 97.00%. Physical Exam  Assessment/Plan: See dict  Nayeli Calvert 06/25/2011, 9:40 AM

## 2011-06-25 NOTE — Progress Notes (Signed)
Chart reviewed. Discussed with Dr. Gerilyn Pilgrim  Subjective: No changes  Objective: Vital signs in last 24 hours: Filed Vitals:   06/25/11 0237 06/25/11 0238 06/25/11 0315 06/25/11 0525  BP:   120/80 119/72  Pulse:   54 53  Temp: 98.1 F (36.7 C) 98.1 F (36.7 C) 97.7 F (36.5 C) 98.2 F (36.8 C)  TempSrc:   Oral Oral  Resp:   16 16  Height:   6\' 3"  (1.905 m)   Weight:   74.8 kg (164 lb 14.5 oz)   SpO2:   98% 97%   Weight change:   Intake/Output Summary (Last 24 hours) at 06/25/11 1314 Last data filed at 06/25/11 0900  Gross per 24 hour  Intake      0 ml  Output      0 ml  Net      0 ml   Neuro : No numbness involving the right for head. Right eye unable to pass midline laterally Lungs clear to auscultation bilaterally without wheeze rhonchi or rales Cardiovascular regular rate rhythm without murmurs gallops rubs  Lab Results: Basic Metabolic Panel:  Lab 06/25/11 5409 06/24/11 2249  NA 138 139  K 3.8 3.9  CL 103 101  CO2 26 29  GLUCOSE 97 123*  BUN 10 11  CREATININE 0.84 0.94  CALCIUM 9.4 10.0  MG -- --  PHOS -- --   Liver Function Tests:  Lab 06/25/11 0626 06/24/11 2249  AST 12 13  ALT 6 8  ALKPHOS 101 114  BILITOT 0.3 0.3  PROT 6.9 7.7  ALBUMIN 3.3* 3.8   No results found for this basename: LIPASE:2,AMYLASE:2 in the last 168 hours No results found for this basename: AMMONIA:2 in the last 168 hours CBC:  Lab 06/25/11 0626 06/24/11 2249  WBC 5.2 5.8  NEUTROABS -- 3.1  HGB 15.0 15.7  HCT 44.7 46.6  MCV 92.2 92.8  PLT 346 327   Cardiac Enzymes: No results found for this basename: CKTOTAL:3,CKMB:3,CKMBINDEX:3,TROPONINI:3 in the last 168 hours BNP: No results found for this basename: PROBNP:3 in the last 168 hours D-Dimer: No results found for this basename: DDIMER:2 in the last 168 hours CBG: No results found for this basename: GLUCAP:6 in the last 168 hours Hemoglobin A1C: No results found for this basename: HGBA1C in the last 168  hours Fasting Lipid Panel: No results found for this basename: CHOL,HDL,LDLCALC,TRIG,CHOLHDL,LDLDIRECT in the last 811 hours Thyroid Function Tests: No results found for this basename: TSH,T4TOTAL,FREET4,T3FREE,THYROIDAB in the last 168 hours Coagulation:  Lab 06/24/11 2249  LABPROT 13.2  INR 0.98   Anemia Panel: No results found for this basename: VITAMINB12,FOLATE,FERRITIN,TIBC,IRON,RETICCTPCT in the last 168 hours Urine Drug Screen: Drugs of Abuse     Component Value Date/Time   LABOPIA NONE DETECTED 06/25/2011 0035   COCAINSCRNUR NONE DETECTED 06/25/2011 0035   LABBENZ NONE DETECTED 06/25/2011 0035   AMPHETMU POSITIVE* 06/25/2011 0035   THCU NONE DETECTED 06/25/2011 0035   LABBARB NONE DETECTED 06/25/2011 0035   Micro Results: No results found for this or any previous visit (from the past 240 hour(s)). Studies/Results: Ct Head Wo Contrast  06/25/2011  *RADIOLOGY REPORT*  Clinical Data: New right facial nerve palsy.  CT HEAD WITHOUT CONTRAST  Technique:  Contiguous axial images were obtained from the base of the skull through the vertex without contrast.  Comparison: 06/11/2011  Findings: There is no evidence for acute hemorrhage, hydrocephalus, mass lesion, or abnormal extra-axial fluid collection.  No definite CT evidence for acute infarction.  The visualized paranasal sinuses and mastoid air cells are predominately clear.  Right maxillary sinus mucous retention cyst versus polyp and minimal ethmoid air cell opacification.  IMPRESSION: No acute intracranial abnormality.  Original Report Authenticated By: Waneta Martins, M.D.   Mr [name] Wo Contrast  06/25/2011  *RADIOLOGY REPORT*  Clinical Data: Blurred vision.  Double vision.  Headache.  MRI HEAD WITHOUT AND WITH CONTRAST  Technique:  Multiplanar, multiecho pulse sequences of the brain and surrounding structures were obtained according to standard protocol without and with intravenous contrast  Contrast: 15mL MULTIHANCE GADOBENATE  DIMEGLUMINE 529 MG/ML IV SOLN  Comparison: Head CT 06/25/2011  Findings: The brain has a normal appearance on all pulse sequences without evidence of malformation, atrophy, old or acute infarction, mass lesion, hemorrhage, hydrocephalus or extra-axial collection. No evidence of demyelinating disease.  No pituitary mass.  There is minimal mucosal thickening within the right maxillary sinus.  No skull or skull base lesion. The patient has asymmetric fat in the petrous apex on the right, finding that I think  is incidental in this case.  IMPRESSION: Normal appearance of the brain.  Minimal mucosal thickening of the right maxillary sinus.  Original Report Authenticated By: Thomasenia Sales, M.D.   Scheduled Meds:   . acetaminophen      . docusate sodium  100 mg Oral BID  . HYDROcodone-acetaminophen  1 tablet Oral Once  . penicillin v potassium  500 mg Oral QID  . sodium chloride  3 mL Intravenous Q12H  . DISCONTD: aspirin EC  325 mg Oral Daily  . 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 Will order lumbar puncture. Will order the usual studies as well as VDRL, cryptococcal antigen, AFB. Patient has already received Lovenox today. Will stop it and also stop aspirin. Discontinue telemetry. Patient denies recreational drug use, but did not confront him about positive urine drug screen in front of his family member.   LOS: 1 day   Colton Sanders 06/25/2011, 1:14 PM

## 2011-06-25 NOTE — H&P (Addendum)
Colton Sanders is an 36 y.o. male.    PCP: Gentry Fitz  Chief Complaint: Headache and blurred vision  HPI: This is a 36 year old, African American male, who was in his usual state of health about 2 weeks ago, when he first started noticing that he had a headache. He, subsequently, had a tooth extraction last Thursday. The headache improved some, but persisted around his right eye socket. He felt numbness around the right side of the face. And, then about a week ago, he started noticing that he was having blurred vision and double vision. When he would cover either of his eyes he could see normally. The headache is not severe at this time. He denies any fever or chills. Denies any seizure activity. No focal deficits except for the numbness and perceived weakness in the right side of the face. He's never had similar symptoms in the past. Patient presented to the emergency department on May 29 with complaints of headache and was treated for sinusitis.   Home Medications: Prior to Admission medications   Medication Sig Start Date End Date Taking? Authorizing Provider  ibuprofen (ADVIL,MOTRIN) 200 MG tablet Take 800 mg by mouth as needed. For pain   Yes Historical Provider, MD  penicillin v potassium (VEETID) 500 MG tablet Take 500 mg by mouth 4 (four) times daily. 06/19/11 06/25/11 Yes Historical Provider, MD    Allergies:  Allergies  Allergen Reactions  . Bee Venom Hives and Swelling    Past Medical History: Past Medical History  Diagnosis Date  . Migraine     Past Surgical History  Procedure Date  . Tooth extraction 05/2011    Social History:  reports that he has been smoking Cigars.  He does not have any smokeless tobacco history on file. He reports that he drinks alcohol. He reports that he does not use illicit drugs. he drinks a 40 ounce of beer about 2-3 times a week   Family History: History of, diabetes, and hypertension in the family  Review of Systems - History obtained  from the patient General ROS: negative Psychological ROS: negative Ophthalmic ROS: as in hpi ENT ROS: negative Allergy and Immunology ROS: negative Hematological and Lymphatic ROS: negative Endocrine ROS: negative Respiratory ROS: no cough, shortness of breath, or wheezing Cardiovascular ROS: no chest pain or dyspnea on exertion Gastrointestinal ROS: no abdominal pain, change in bowel habits, or black or bloody stools Genito-Urinary ROS: no dysuria, trouble voiding, or hematuria Musculoskeletal ROS: negative Neurological ROS: as in hpi Dermatological ROS: negative  Physical Examination Blood pressure 128/79, pulse 58, temperature 98.1 F (36.7 C), temperature source Oral, resp. rate 18, height 6\' 3"  (1.905 m), weight 74.844 kg (165 lb), SpO2 100.00%.  General appearance: alert, cooperative, appears stated age and no distress Head: Normocephalic, without obvious abnormality, atraumatic Eyes: conjunctivae/corneas clear. PERRL. Marland Kitchen He was unable to laterally rotate his right eye Throat: lips, mucosa, and tongue normal; teeth and gums normal Neck: no adenopathy, no carotid bruit, no JVD, supple, symmetrical, trachea midline and thyroid not enlarged, symmetric, no tenderness/mass/nodules Back: symmetric, no curvature. ROM normal. No CVA tenderness. Resp: clear to auscultation bilaterally Cardio: regular rate and rhythm, S1, S2 normal, no murmur, click, rub or gallop GI: soft, non-tender; bowel sounds normal; no masses,  no organomegaly Extremities: extremities normal, atraumatic, no cyanosis or edema Pulses: 2+ and symmetric Skin: Skin color, texture, turgor normal. No rashes or lesions Lymph nodes: Cervical, supraclavicular, and axillary nodes normal. Neurologic: He is alert and oriented x3.  He does have some sensory deficits in the right side of his face. More so around the right eye. 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 Otherwise, there no other focal neurological deficits  appreciated  Laboratory Data: Results for orders placed during the hospital encounter of 06/24/11 (from the past 48 hour(s))  CBC     Status: Normal   Collection Time   06/24/11 10:49 PM      Component Value Range Comment   WBC 5.8  4.0 - 10.5 (K/uL)    RBC 5.02  4.22 - 5.81 (MIL/uL)    Hemoglobin 15.7  13.0 - 17.0 (g/dL)    HCT 45.4  09.8 - 11.9 (%)    MCV 92.8  78.0 - 100.0 (fL)    MCH 31.3  26.0 - 34.0 (pg)    MCHC 33.7  30.0 - 36.0 (g/dL)    RDW 14.7  82.9 - 56.2 (%)    Platelets 327  150 - 400 (K/uL)   DIFFERENTIAL     Status: Normal   Collection Time   06/24/11 10:49 PM      Component Value Range Comment   Neutrophils Relative 53  43 - 77 (%)    Neutro Abs 3.1  1.7 - 7.7 (K/uL)    Lymphocytes Relative 37  12 - 46 (%)    Lymphs Abs 2.2  0.7 - 4.0 (K/uL)    Monocytes Relative 6  3 - 12 (%)    Monocytes Absolute 0.3  0.1 - 1.0 (K/uL)    Eosinophils Relative 3  0 - 5 (%)    Eosinophils Absolute 0.2  0.0 - 0.7 (K/uL)    Basophils Relative 1  0 - 1 (%)    Basophils Absolute 0.1  0.0 - 0.1 (K/uL)   COMPREHENSIVE METABOLIC PANEL     Status: Abnormal   Collection Time   06/24/11 10:49 PM      Component Value Range Comment   Sodium 139  135 - 145 (mEq/L)    Potassium 3.9  3.5 - 5.1 (mEq/L)    Chloride 101  96 - 112 (mEq/L)    CO2 29  19 - 32 (mEq/L)    Glucose, Bld 123 (*) 70 - 99 (mg/dL)    BUN 11  6 - 23 (mg/dL)    Creatinine, Ser 1.30  0.50 - 1.35 (mg/dL)    Calcium 86.5  8.4 - 10.5 (mg/dL)    Total Protein 7.7  6.0 - 8.3 (g/dL)    Albumin 3.8  3.5 - 5.2 (g/dL)    AST 13  0 - 37 (U/L)    ALT 8  0 - 53 (U/L)    Alkaline Phosphatase 114  39 - 117 (U/L)    Total Bilirubin 0.3  0.3 - 1.2 (mg/dL)    GFR calc non Af Amer >90  >90 (mL/min)    GFR calc Af Amer >90  >90 (mL/min)   APTT     Status: Normal   Collection Time   06/24/11 10:49 PM      Component Value Range Comment   aPTT 27  24 - 37 (seconds)   PROTIME-INR     Status: Normal   Collection Time   06/24/11 10:49 PM       Component Value Range Comment   Prothrombin Time 13.2  11.6 - 15.2 (seconds)    INR 0.98  0.00 - 1.49      Radiology Reports: No results found.  Electrocardiogram: pending  Assessment/Plan  Principal Problem:  *  6th nerve palsy Active Problems:  Headache   #1 6th Nerve Palsy, and headache: Etiology remains unclear although it could be related to his recent tooth extraction. Myasthenia gravis is also in the differential. He will require MRI of his brain. The case was discussed by the ED physician with the neurologist. EKG will be done. Urine drug screen will be checked. Neurology will be consulted. Depending on the results of the MRI further workup may be required.  #2 recent tooth extraction: Continue with his penicillin for 2 more days to complete the course.  DVT prophylaxis will be initiated.  He is a full code.  Further management decisions will depend on results of further testing and patient's response to treatment.  [facility]  [facility] Pager [phone]  06/25/2011, 12:15 AM

## 2011-06-25 NOTE — Consult Note (Signed)
Colton Sanders, Colton Sanders             ACCOUNT NO.:  [id number]  MEDICAL RECORD NO.:  [id number]  LOCATION:  A329                          FACILITY:  [facility]  PHYSICIAN:  Versie Soave A. Gerilyn Pilgrim, M.D. DATE OF BIRTH:  April 24, 1975  DATE OF CONSULTATION: DATE OF DISCHARGE:                                CONSULTATION   REASON FOR CONSULTATION:  Diplopia and headache.  HISTORY OF PRESENT ILLNESS:  The patient is a 36 year old black male, who presents with about 2-week history of headache involving the right frontal area, mostly involving the V1 distribution. He also has had numbness in the same distribution.  The patient was seen in emergency room couple times for his symptoms, in fact he has had 2 head CT scans. He was told that he may have had tooth abscess and in fact got that extracted, however, his symptoms persisted and in fact got worse.  He reports developing blurry of his vision and diplopia which got progressively worse and again this needs medical attention.  He was noted to have a right cranial nerve VII neuropathy and hence was admitted for further evaluation.  The patient does not report symptoms involving the extremities.  There is no focal weakness involving the upper or lower extremities.  No numbness.  The patient does endorse having some tick bites from time to time as he does a lot of outdoor activities, but indicates no bugs were on him for more than 24 hours.  PHYSICAL EXAMINATION:  GENERAL:  Shows a thin pleasant man.  He is in no acute distress. HEENT EVALUATION:  Neck is supple.  Head is normocephalic, atraumatic. ABDOMEN:  Soft. EXTREMITIES:  No edema. NEUROLOGIC:  Mentation:  He is awake and alert.  He is lucid and coherent.  Speech is normal.  Language and cognition also intact. Cranial nerve evaluation shows clear weakness of the lateral rectus muscle on the right side.  Other eye movements are intact, however, including visual eye movements.  Facial muscle strength is  symmetric and normal.  He has reduced sensation of V1 distribution on the right side. Tongue is midline.  Uvula midline.  Shoulder shrug is normal.  Motor examination shows normal tone, bulk, and strength.  I see no pronator drift.  Sensation is normal in the upper and lower extremities. Coordination shows no dysmetria.  There is no past-pointing, no tremors. Reflexes are normal.  Plantars are both downgoing.  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  Differential is wide including demyelinating lesion/multiple sclerosis.  Ischemia is possibility but unlikely given his age and lack of risk factors, other possibilities include inflammatory and infectious diseases such as sarcoidosis and vasculitis. The patient has an MRI with and without contrast pending.  Additional blood tests will be done including angiotensin-converting enzymes, sed rate, ANA, RPR, and thyroid function test.  The patient will likely need to have a spinal tap once the MRI is obtained.  We will continue follow the patient.     Brande Uncapher A. Gerilyn Pilgrim, M.D.     KAD/MEDQ  D:  06/25/2011  T:  06/25/2011  Job:  [id number]

## 2011-06-25 NOTE — Care Management Note (Unsigned)
    Page 1 of 1   06/25/2011     1:04:26 PM   CARE MANAGEMENT NOTE 06/25/2011  Patient:  Colton Sanders, Colton Sanders   Account Number:  [id number]  Date Initiated:  06/25/2011  Documentation initiated by:  Rosemary Holms  Subjective/Objective Assessment:   Pt admitted from home where he lives with his parents. Independent. Self Pay and has no PCP. C/O seeing double.     Action/Plan:   Spoke to pt at bedside. No HH needs identified. CM advised pt that [facility] was taking new patients on a sliding scale pay system and recommended him getting established with a PCP. CM will assist if PCP needed at DC   Anticipated DC Date:  06/26/2011   Anticipated DC Plan:  HOME/SELF CARE  In-house referral  Financial Counselor      DC Planning Services  CM consult      Choice offered to / List presented to:             Status of service:  In process, will continue to follow Medicare Important Message given?   (If response is "NO", the following Medicare IM given date fields will be blank) Date Medicare IM given:   Date Additional Medicare IM given:    Discharge Disposition:    Per UR Regulation:    If discussed at Long Length of Stay Meetings, dates discussed:    Comments:  06/25/11 1200 Sabre Romberger Leanord Hawking RN BSN CM

## 2011-06-25 NOTE — Progress Notes (Signed)
Utilization Review Complete  

## 2011-06-26 ENCOUNTER — Inpatient Hospital Stay (HOSPITAL_COMMUNITY): Payer: Self-pay

## 2011-06-26 DIAGNOSIS — R51 Headache: Secondary | ICD-10-CM

## 2011-06-26 DIAGNOSIS — H4920 Sixth [abducent] nerve palsy, unspecified eye: Secondary | ICD-10-CM

## 2011-06-26 LAB — ANA: Anti Nuclear Antibody(ANA): NEGATIVE

## 2011-06-26 LAB — PROTEIN AND GLUCOSE, CSF
Glucose, CSF: 65 mg/dL (ref 43–76)
Total  Protein, CSF: 26 mg/dL (ref 15–45)

## 2011-06-26 LAB — CSF CELL COUNT WITH DIFFERENTIAL: WBC, CSF: 4 /mm3 (ref 0–5)

## 2011-06-26 MED ORDER — PREDNISONE 20 MG PO TABS
50.0000 mg | ORAL_TABLET | Freq: Two times a day (BID) | ORAL | Status: DC
  Administered 2011-06-26: 50 mg via ORAL
  Filled 2011-06-26: qty 1
  Filled 2011-06-26: qty 2

## 2011-06-26 NOTE — Procedures (Signed)
Preprocedure Dx: Multiple cranial nerve palsies Postprocedure Dx: Multiple cranial nerve palsies Procedure:  Fluoroscopically guided lumbar puncture Radiologist:  Tyron Russell Anesthesia:  2.5 ml of 1% lidocaine Specimen:  8 ml CSF, clear & colorless EBL:   None Opening pressure: 10 cm H2O No immediate complications Complications: **

## 2011-06-26 NOTE — Progress Notes (Signed)
Subjective: Feels better with eye patch on. No change in his symptoms except his headache is slightly improved.  Objective: Vital signs in last 24 hours: Filed Vitals:   06/25/11 1427 06/25/11 2140 06/26/11 0619 06/26/11 1211  BP: 115/76 125/76 122/78 119/74  Pulse: 78 60 62 56  Temp: 97.8 F (36.6 C) 98.2 F (36.8 C) 98.2 F (36.8 C)   TempSrc:  Oral Oral Oral  Resp: 16 18 18 16   Height:      Weight:      SpO2: 96% 100% 98% 100%   Weight change:   Intake/Output Summary (Last 24 hours) at 06/26/11 1424 Last data filed at 06/26/11 0800  Gross per 24 hour  Intake    360 ml  Output      0 ml  Net    360 ml   Neuro : Eye patch in place. Lungs clear to auscultation bilaterally without wheeze rhonchi or rales Cardiovascular regular rate rhythm without murmurs gallops rubs  Lab Results: Basic Metabolic Panel:  Lab 06/25/11 9562 06/24/11 2249  NA 138 139  K 3.8 3.9  CL 103 101  CO2 26 29  GLUCOSE 97 123*  BUN 10 11  CREATININE 0.84 0.94  CALCIUM 9.4 10.0  MG -- --  PHOS -- --   Liver Function Tests:  Lab 06/25/11 0626 06/24/11 2249  AST 12 13  ALT 6 8  ALKPHOS 101 114  BILITOT 0.3 0.3  PROT 6.9 7.7  ALBUMIN 3.3* 3.8   No results found for this basename: LIPASE:2,AMYLASE:2 in the last 168 hours No results found for this basename: AMMONIA:2 in the last 168 hours CBC:  Lab 06/25/11 0626 06/24/11 2249  WBC 5.2 5.8  NEUTROABS -- 3.1  HGB 15.0 15.7  HCT 44.7 46.6  MCV 92.2 92.8  PLT 346 327   Cardiac Enzymes: No results found for this basename: CKTOTAL:3,CKMB:3,CKMBINDEX:3,TROPONINI:3 in the last 168 hours BNP: No results found for this basename: PROBNP:3 in the last 168 hours D-Dimer: No results found for this basename: DDIMER:2 in the last 168 hours CBG: No results found for this basename: GLUCAP:6 in the last 168 hours Hemoglobin A1C: No results found for this basename: HGBA1C in the last 168 hours Fasting Lipid Panel: No results found for this  basename: CHOL,HDL,LDLCALC,TRIG,CHOLHDL,LDLDIRECT in the last 130 hours Thyroid Function Tests:  Lab 06/25/11 1038  TSH 0.440  T4TOTAL --  FREET4 --  T3FREE --  THYROIDAB --   Coagulation:  Lab 06/24/11 2249  LABPROT 13.2  INR 0.98   Anemia Panel:  Lab 06/25/11 1038  VITAMINB12 508  FOLATE --  FERRITIN --  TIBC --  IRON --  RETICCTPCT --   Urine Drug Screen: Drugs of Abuse     Component Value Date/Time   LABOPIA NONE DETECTED 06/25/2011 0035   COCAINSCRNUR NONE DETECTED 06/25/2011 0035   LABBENZ NONE DETECTED 06/25/2011 0035   AMPHETMU POSITIVE* 06/25/2011 0035   THCU NONE DETECTED 06/25/2011 0035   LABBARB NONE DETECTED 06/25/2011 0035   Micro Results: No results found for this or any previous visit (from the past 240 hour(s)). Studies/Results: Ct Head Wo Contrast  06/25/2011  *RADIOLOGY REPORT*  Clinical Data: New right facial nerve palsy.  CT HEAD WITHOUT CONTRAST  Technique:  Contiguous axial images were obtained from the base of the skull through the vertex without contrast.  Comparison: 06/11/2011  Findings: There is no evidence for acute hemorrhage, hydrocephalus, mass lesion, or abnormal extra-axial fluid collection.  No definite CT evidence  for acute infarction.  The visualized paranasal sinuses and mastoid air cells are predominately clear.  Right maxillary sinus mucous retention cyst versus polyp and minimal ethmoid air cell opacification.  IMPRESSION: No acute intracranial abnormality.  Original Report Authenticated By: Waneta Martins, M.D.   Mr [name] Wo Contrast  06/25/2011  *RADIOLOGY REPORT*  Clinical Data: Blurred vision.  Double vision.  Headache.  MRI HEAD WITHOUT AND WITH CONTRAST  Technique:  Multiplanar, multiecho pulse sequences of the brain and surrounding structures were obtained according to standard protocol without and with intravenous contrast  Contrast: 15mL MULTIHANCE GADOBENATE DIMEGLUMINE 529 MG/ML IV SOLN  Comparison: Head CT 06/25/2011   Findings: The brain has a normal appearance on all pulse sequences without evidence of malformation, atrophy, old or acute infarction, mass lesion, hemorrhage, hydrocephalus or extra-axial collection. No evidence of demyelinating disease.  No pituitary mass.  There is minimal mucosal thickening within the right maxillary sinus.  No skull or skull base lesion. The patient has asymmetric fat in the petrous apex on the right, finding that I think  is incidental in this case.  IMPRESSION: Normal appearance of the brain.  Minimal mucosal thickening of the right maxillary sinus.  Original Report Authenticated By: Thomasenia Sales, M.D.   Dg Fluoro Guide Lumbar Puncture  06/26/2011  *RADIOLOGY REPORT*  Clinical Data: Headaches, multiple cranial nerve palsies  FLUOROSCOPIC GUIDED LUMBAR PUNCTURE:  Technique: Written informed consent for fluoroscopic-guided lumbar puncture was obtained. Patient was placed prone. L4-L5 disc space was localized under fluoroscopy. Skin prepped and draped in usual sterile fashion. Skin and soft tissues anesthetized with 1.25 ml of 1% lidocaine. 22 gauge spinal needle advanced into spinal canal. A tiny amount of CSF was seen within the needle hub but CSF flow could not be established.  Skin at the L3-L4 disc space was then localized under fluoroscopy. Additional local anesthesia utilizing 1.25 ml of 1% lidocaine. 22 gauge spinal needle advanced into the spinal canal. Again, a tiny amount of CSF was seen within the needle but no CSF flow could be established despite tilting the table. Position was assessed with a lateral view and the needle was slightly repositioned. Clear colorless CSF was then encountered with opening pressure of 10 cm H2O. 8 ml of CSF was obtained in four tubes for requested laboratory analysis. Procedure tolerated well by patient without immediate complication.  Fluoroscopy time: 1.2 minutes  IMPRESSION: Fluoroscopic guided lumbar puncture as above.  Original Report  Authenticated By: Lollie Marrow, M.D.   Scheduled Meds:    . docusate sodium  100 mg Oral BID  . penicillin v potassium  500 mg Oral QID  . sodium chloride  3 mL Intravenous Q12H   Continuous Infusions:  PRN Meds:.acetaminophen, acetaminophen, albuterol, HYDROcodone-acetaminophen, ondansetron (ZOFRAN) IV, ondansetron Assessment/Plan: Principal Problem:  *6th nerve palsy Active Problems:  5th nerve palsy  Headache  urine drug screen positive for amphetamines  CSF shows normal glucose and protein. Cell count and other studies are currently pending. If no signs of infection, steroids may be tried per neurology.   LOS: 2 days   Alga Southall L 06/26/2011, 2:24 PM

## 2011-06-26 NOTE — Progress Notes (Signed)
NAMEITHAN, TOUHEY             ACCOUNT NO.:  [id number]  MEDICAL RECORD NO.:  [id number]  LOCATION:  A329                          FACILITY:  [facility]  PHYSICIAN:  Lowana Hable A. Gerilyn Pilgrim, M.D. DATE OF BIRTH:  [birth year]  DATE OF PROCEDURE: DATE OF DISCHARGE:                                PROGRESS NOTE   The patient is doing about the same.  He complains of pain involving the right eye along with diplopia.  Still has numbness in V1 distribution on the right side.  No other complaints are reported.  No focal neurological problem.  Today, he is awake and alert.  He is lucid and coherent.  Speech, language, and cognition intact.  Neck is supple. Head is normocephalic, atraumatic.  The patient has pupils about 4-5 mm and briskly reactive bilaterally.  Visual fields are intact. Extraocular movements again shows plegia of the lateral rectus muscle on the right.  Vertically, the eye movements are good.  Again, he continued to have reduced sensation in V1 distribution on the right side.  Facial muscle strength is actually normal including the orbicularis oculi muscles bilaterally.  He has good strength in upper extremities.  MRI and was in abnormal.  Laboratory evaluation so far has been normal.  I do not see an angiotensin-converting enzyme, although it was ordered. However other labs were unremarkable.  RPR nonreactive.  TSH 0.44, lower end of the normal range.  Vitamin B12 of 508.  Homocystine 8.6 and sed rate of 5.  ASSESSMENT AND PLAN:  Cranial neuropathies, unclear etiology.  The patient will be set up to have a spinal tap test done today.  We will also arrange for the patient to have an angiotensin-converting enzymes done.  Once the spinal tap test is done and is unremarkable, we will consider empiric treatment with high-dose steroids.     Sedonia Kitner A. Gerilyn Pilgrim, M.D.     KAD/MEDQ  D:  06/26/2011  T:  06/26/2011  Job:  [id number]

## 2011-06-26 NOTE — Progress Notes (Signed)
LP results ok so far.  Will start prednisone 50 bid. 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

## 2011-06-26 NOTE — Progress Notes (Signed)
Subjective: Interval History:  Objective: Vital signs in last 24 hours: Temp:  [97.8 F (36.6 C)-98.2 F (36.8 C)] 98.2 F (36.8 C) (06/13 0619) Pulse Rate:  [60-78] 62  (06/13 0619) Resp:  [16-18] 18  (06/13 0619) BP: (115-125)/(76-78) 122/78 mmHg (06/13 0619) SpO2:  [96 %-100 %] 98 % (06/13 0619)  Intake/Output from previous day:   Intake/Output this shift:   Nutritional status: General   Lab Results:  [facility] 06/25/11 0626 06/24/11 2249  WBC 5.2 5.8  HGB 15.0 15.7  HCT 44.7 46.6  PLT 346 327  NA 138 139  K 3.8 3.9  CL 103 101  CO2 26 29  GLUCOSE 97 123*  BUN 10 11  CREATININE 0.84 0.94  CALCIUM 9.4 10.0  LABA1C -- --   Lipid Panel No results found for this basename: CHOL,TRIG,HDL,CHOLHDL,VLDL,LDLCALC in the last 72 hours  Studies/Results: Ct Head Wo Contrast  06/25/2011  *RADIOLOGY REPORT*  Clinical Data: New right facial nerve palsy.  CT HEAD WITHOUT CONTRAST  Technique:  Contiguous axial images were obtained from the base of the skull through the vertex without contrast.  Comparison: 06/11/2011  Findings: There is no evidence for acute hemorrhage, hydrocephalus, mass lesion, or abnormal extra-axial fluid collection.  No definite CT evidence for acute infarction.  The visualized paranasal sinuses and mastoid air cells are predominately clear.  Right maxillary sinus mucous retention cyst versus polyp and minimal ethmoid air cell opacification.  IMPRESSION: No acute intracranial abnormality.  Original Report Authenticated By: Waneta Martins, M.D.   Mr [name] Wo Contrast  06/25/2011  *RADIOLOGY REPORT*  Clinical Data: Blurred vision.  Double vision.  Headache.  MRI HEAD WITHOUT AND WITH CONTRAST  Technique:  Multiplanar, multiecho pulse sequences of the brain and surrounding structures were obtained according to standard protocol without and with intravenous contrast  Contrast: 15mL MULTIHANCE GADOBENATE DIMEGLUMINE 529 MG/ML IV SOLN  Comparison: Head CT 06/25/2011   Findings: The brain has a normal appearance on all pulse sequences without evidence of malformation, atrophy, old or acute infarction, mass lesion, hemorrhage, hydrocephalus or extra-axial collection. No evidence of demyelinating disease.  No pituitary mass.  There is minimal mucosal thickening within the right maxillary sinus.  No skull or skull base lesion. The patient has asymmetric fat in the petrous apex on the right, finding that I think  is incidental in this case.  IMPRESSION: Normal appearance of the brain.  Minimal mucosal thickening of the right maxillary sinus.  Original Report Authenticated By: Thomasenia Sales, M.D.    Medications:  Scheduled Meds:   . docusate sodium  100 mg Oral BID  . penicillin v potassium  500 mg Oral QID  . sodium chloride  3 mL Intravenous Q12H  . DISCONTD: aspirin EC  325 mg Oral Daily  . DISCONTD: enoxaparin  40 mg Subcutaneous Q24H   Continuous Infusions:  PRN Meds:.acetaminophen, acetaminophen, albuterol, gadobenate dimeglumine, HYDROcodone-acetaminophen, ondansetron (ZOFRAN) IV, ondansetron   Assessment/Plan: See dict   LOS: 2 days   Colton Sanders

## 2011-06-27 DIAGNOSIS — R51 Headache: Secondary | ICD-10-CM

## 2011-06-27 DIAGNOSIS — H4920 Sixth [abducent] nerve palsy, unspecified eye: Secondary | ICD-10-CM

## 2011-06-27 LAB — VDRL, CSF: VDRL Quant, CSF: NONREACTIVE

## 2011-06-27 MED ORDER — PREDNISONE 20 MG PO TABS: 60.0000 mg | ORAL_TABLET | Freq: Every day | ORAL | Status: DC

## 2011-06-27 MED ORDER — SODIUM CHLORIDE 0.9 % IV SOLN
1000.0000 mg | Freq: Every day | INTRAVENOUS | Status: DC
  Administered 2011-06-27: 1000 mg via INTRAVENOUS
  Filled 2011-06-27 (×4): qty 8

## 2011-06-27 MED ORDER — HYDROCODONE-ACETAMINOPHEN 5-325 MG PO TABS: 1.0000 | ORAL_TABLET | Freq: Four times a day (QID) | ORAL | Status: AC | PRN

## 2011-06-27 MED ORDER — PREDNISONE 10 MG PO TABS: ORAL_TABLET | ORAL | Status: DC

## 2011-06-27 NOTE — Discharge Summary (Signed)
Physician Discharge Summary  Patient ID: MAL ASHER MRN: [id number] DOB/AGE: 1975/02/20 36 y.o.  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      ibuprofen 200 MG tablet   Commonly known as: ADVIL,MOTRIN   Take 800 mg by mouth as needed. For pain      predniSONE 10 MG tablet   Commonly known as: DELTASONE   Take 5 tablets twice per day in the morning and evening for 2 days, then 6 tablets once daily for 2 days, then 5 tablets once daily for 2 days, then 4 tablets once daily for 2 days, then 3 tablets daily for 2 days, then 2 tablets daily for 2 days, then 1 tablet daily for 2 days            Discharge Orders    Future Orders Please Complete By Expires   Activity as tolerated - No restrictions         Follow-up Information    Follow up with DOONQUAH,KOFI, MD in 2 weeks. (Please call office to schedule appointment)    Contact information:   [address] [place] [address] [phone]         Disposition: 01-Home or Self Care  Discharged Condition: stable  Consults: Treatment Team:  Beryle Beams, MD Radiology  Labs:    Sodium  138       Potassium  3.8       Chloride  103       CO2  26       BUN  10       Creatinine, Ser  0.84       Calcium  9.4       GFR calc non Af Amer  90 mL/min">90       GFR calc Af Amer  90 mL/min  The eGFR has been calculated using the CKD EPI  equation. This calculation has not been validated in all clinical situations. eGFR's persistently <90 mL/min signify possible Chronic Kidney Disease.">9090 mL/min  The eGFR has been calculated using the CKD EPI equation. This calculation has not been validated in all clinical situations. eGFR's persistently <90 mL/min signify possible Chronic Kidney Disease." border=0 src="[email or web address]:/[email or web address]" width=5 height=10       Glucose, Bld  97       Alkaline Phosphatase  101       Albumin  3.3       AST  12       ALT  6       Total Protein  6.9       Total Bilirubin  0.3        CARDIAC PROFILE    Troponin i, poc  0.00       Angio Convert Enzyme    4      OTHER CHEM    Vitamin  B-12   508       AMINO ACIDS    Homocysteine-Norm   8.6       CBC    WBC  5.2       RBC  4.85       Hemoglobin  15.0       HCT  44.7       MCV  92.2       MCH  30.9       MCHC  33.6       RDW  14.8       Platelets  346        DIFFERENTIAL    Neutrophils Relative  53       Lymphocytes Relative  37       Monocytes Relative  6       Eosinophils Relative  3       Basophils Relative  1       Neutro Abs  3.1       Lymphs Abs  2.2       Monocytes Absolute  0.3       Eosinophils Absolute  0.2       Basophils Absolute  0.1        OTHER HEMATOLOGY    Sed Rate   5       PROTIME W/ INR    Prothrombin Time  13.2       INR  0.98        PTT    aPTT  27        DIABETES    Glucose, Bld  97        THYROID    TSH   0.440       AUTOIMMUNE    ANA   NEGATIVE       BACTERIAL TESTS    VDRL Quant, CSF    NON REACTIVE     RPR   NON REACTIVE       FUNGAL TESTS    Crypto Ag    NEGATIVE     Cryptococcal Ag Titer    NOT INDICATED      SPINAL FLUID    Glucose, CSF    65     Total Protein, CSF    26     RBC Count, CSF    1     WBC, CSF    4     Segmented Neutrophils-CSF    TOO FEW TO COUNT, SMEAR AVAILABLE FOR REVIEW     Appearance, CSF     CLEAR     Color, CSF    COLORLESS     Supernatant    NOT INDICATED     Tube #    4      TOX, URINE    Amphetamines  POSITIVE        Barbiturates  NONE DETECTED        Benzodiazepines  NONE DETECTED       Opiates  NONE DETECTED       Cocaine  NONE DETECTED       Tetrahydrocannabinol  NONE DETECTED         Diagnostics:  Ct Head Wo Contrast  06/25/2011  *RADIOLOGY REPORT*  Clinical Data: New right facial nerve palsy.  CT HEAD WITHOUT CONTRAST  Technique:  Contiguous axial images were obtained from the base of the skull through the vertex without contrast.  Comparison: 06/11/2011  Findings: There is no evidence for acute hemorrhage,  hydrocephalus, mass lesion, or abnormal extra-axial fluid collection.  No definite CT evidence for acute infarction.  The visualized paranasal sinuses and mastoid air cells are predominately clear.  Right maxillary sinus mucous retention cyst versus polyp and minimal ethmoid air cell opacification.  IMPRESSION: No acute intracranial abnormality.  Original Report Authenticated By: Waneta Martins, M.D.   Ct Head Wo Contrast  06/11/2011  *RADIOLOGY REPORT*  Clinical Data: Right-sided headache for 6 days  CT HEAD WITHOUT CONTRAST  Technique:  Contiguous axial images were obtained from the base of the skull through the vertex without contrast.  Comparison: None.  Findings: There is no evidence for acute infarction, intracranial hemorrhage, mass lesion, hydrocephalus, or extra-axial fluid. There is no atrophy or white matter disease.  The calvarium is intact.  Mild chronic sinus disease is evident.  There is no acute mastoid fluid.  IMPRESSION: Negative exam.  Original Report Authenticated By: Elsie Stain, M.D.   Mr [name] Wo Contrast  06/25/2011  *RADIOLOGY REPORT*  Clinical Data: Blurred vision.  Double vision.  Headache.  MRI HEAD WITHOUT AND WITH CONTRAST  Technique:  Multiplanar, multiecho pulse sequences of the brain and surrounding structures were  obtained according to standard protocol without and with intravenous contrast  Contrast: 15mL MULTIHANCE GADOBENATE DIMEGLUMINE 529 MG/ML IV SOLN  Comparison: Head CT 06/25/2011  Findings: The brain has a normal appearance on all pulse sequences without evidence of malformation, atrophy, old or acute infarction, mass lesion, hemorrhage, hydrocephalus or extra-axial collection. No evidence of demyelinating disease.  No pituitary mass.  There is minimal mucosal thickening within the right maxillary sinus.  No skull or skull base lesion. The patient has asymmetric fat in the petrous apex on the right, finding that I think  is incidental in this case.  IMPRESSION: Normal appearance of the brain.  Minimal mucosal thickening of the right maxillary sinus.  Original Report Authenticated By: Thomasenia Sales, M.D.   Dg Fluoro Guide Lumbar Puncture  06/26/2011  *RADIOLOGY REPORT*  Clinical Data: Headaches, multiple cranial nerve palsies  FLUOROSCOPIC GUIDED LUMBAR PUNCTURE:  Technique: Written informed consent for fluoroscopic-guided lumbar puncture was obtained. Patient was placed prone. L4-L5 disc space was localized under fluoroscopy. Skin prepped and draped in usual sterile fashion. Skin and soft tissues anesthetized with 1.25 ml of 1% lidocaine. 22 gauge spinal needle advanced into spinal canal. A tiny amount of CSF was seen within the needle hub but CSF flow could not be established.  Skin at the L3-L4 disc space was then localized under fluoroscopy. Additional local anesthesia utilizing 1.25 ml of 1% lidocaine. 22 gauge spinal needle advanced into the spinal canal. Again, a tiny amount of CSF was seen within the needle but no CSF flow could be established despite tilting the table. Position was assessed with a lateral view and the needle was slightly repositioned. Clear colorless CSF was then encountered with opening pressure of 10 cm H2O. 8 ml of CSF was obtained in four tubes for requested laboratory analysis.  Procedure tolerated well by patient without immediate complication.  Fluoroscopy time: 1.2 minutes  IMPRESSION: Fluoroscopic guided lumbar puncture as above.  Original Report Authenticated By: Lollie Marrow, M.D.   Procedures: LP  Full Code   Hospital Course: See H&P for complete admission details. Mr. Day is a pleasant 36 year old unassigned black male who presented with diplopia and right periorbital headache. He has had a recent tooth extraction. The headache started prior to the procedure. He thinks the visual changes may  have occurred then as well, but worsened recently. In the emergency room, he was noted to have a sixth cranial nerve palsy on the right. He also had evidence of fifth cranial nerve dysfunction. CAT scan and MRI of the brain were negative. Dr. Gerilyn Pilgrim was consulted and recommended lumbar puncture and if no evidence of infection, high-dose steroids. His LP showed nothing concerning. He was given a gram of Solu-Medrol and sent home on prednisone taper. He will followup with Dr. Gerilyn Pilgrim as an outpatient. Wearing an eye patch has helped. Total time on the day of discharge greater than 30 minutes.  Discharge Exam:  Blood pressure 109/71, pulse 69, temperature 97.4 F (36.3 C), temperature source Oral, resp. rate 20, height 6\' 3"  (1.905 m), weight 74.8 kg (164 lb 14.5 oz), SpO2 95.00%.  Unchanged from 06/26/11  Signed: Crista Curb L 06/27/2011, 12:12 PM

## 2011-06-27 NOTE — Progress Notes (Signed)
Colton Sanders, Colton Sanders             ACCOUNT NO.:  [id number]  MEDICAL RECORD NO.:  [id number]  LOCATION:  A329                          FACILITY:  [facility]  PHYSICIAN:  Esthefany Herrig A. Gerilyn Pilgrim, M.D. DATE OF BIRTH:  Jan 13, 1976  DATE OF PROCEDURE: DATE OF DISCHARGE:                                PROGRESS NOTE   The patient reports no improvement.  He continues to have diplopia, blurred vision, numbness, and pain, V1 distribution on the right side. Examination shows neck is supple.  Head is normocephalic, atraumatic. He has good strength of the orbicularis oculi muscles bilaterally but continues to have lateral rectus weakness on the right side.  Vertical eye movements were good.  All the eye movements are good.  Again reduced sensation in the right V1 distribution.  Lower facial muscles and good. Tongue is midline.  Motor examination shows normal tone, bulk, and strength throughout.  There is no pronator drift.  Coordination shows normal finger-to-nose.  MRI is reviewed in person and there is a couple of tiny white matter lesion involving the left frontal area.  Nothing seen in the posterior circulation, brainstem area.  CSF evaluation has been essentially unremarkable, although some are still pending.  Glucose 65, protein 26.  WBC 4, RBC 1.  Angiotensin-converting enzyme from serum and blood are pending.  VDRL and other tests are pending.  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 The patient will be treated with high-dose steroids.  I will give him a boost of Solu-Medrol 1 g today and he can go home after this and continue with the weaning schedule.  He should follow up in the office with [place] in 3-4 weeks.     Colton Sanders A. Gerilyn Pilgrim, M.D.     KAD/MEDQ  D:  06/27/2011  T:  06/27/2011  Job:  [id number]

## 2011-06-27 NOTE — Progress Notes (Addendum)
Pt discharged with instructions, prescriptions, and carenotes.  He verbalizes understanding.  Pt left the floor ambulating with staff in stable condition.  No further questions or concerns voiced a that time.  Pt provided a eye patch ordered from [employer] prior to discharge.

## 2011-06-27 NOTE — Discharge Instructions (Signed)
Acetaminophen; Hydrocodone tablets or capsules What is this medicine? ACETAMINOPHEN; HYDROCODONE (a set a MEE noe fen; hye droe KOE done) is a pain reliever. It is used to treat mild to moderate pain. This medicine may be used for other purposes; ask your health care provider or pharmacist if you have questions. What should I tell my health care provider before I take this medicine? They need to know if you have any of these conditions: -brain tumor -Crohn's disease, inflammatory bowel disease, or ulcePrednisone tablets What is this medicine? PREDNISONE (PRED ni sone) is a corticosteroid. It is commonly used to treat inflammation of the skin, joints, lungs, and other organs. Common conditions treated include asthma, allergies, and arthritis. It is also used for other conditions, such as blood disorders and diseases of the adrenal glands. This medicine may be used for other purposes; ask your health care provider or pharmacist if you have questions. What should I tell my health care provider before I take this medicine? They need to know if you have any of these conditions: -Cushing's syndrome -diabetes -glaucoma -heart disease -high blood pressure -infection (especially a virus infection such as chickenpox, cold sores, or herpes) -kidney disease -liver disease -mental illness -myasthenia gravis -osteoporosis -seizures -stomach or intestine problems -thyroid disease -an unusual or allergic reaction to lactose, prednisone, other medicines, foods, dyes, or preservatives -pregnant or trying to get pregnant -breast-feeding How should I use this medicine? Take this medicine by mouth with a glass of water. Follow the directions on the prescription label. Take this medicine with food. If you are taking this medicine once a day, take it in the morning. Do not take more medicine than you are told to take. Do not suddenly stop taking your medicine because you may develop a severe reaction. Your  doctor will tell you how much medicine to take. If your doctor wants you to stop the medicine, the dose may be slowly lowered over time to avoid any side effects. Talk to your pediatrician regarding the use of this medicine in children. Special care may be needed. Overdosage: If you think you have taken too much of this medicine contact a poison control center or emergency room at once. NOTE: This medicine is only for you. Do not share this medicine with others. What if I miss a dose? If you miss a dose, take it as soon as you can. If it is almost time for your next dose, talk to your doctor or health care professional. You may need to miss a dose or take an extra dose. Do not take double or extra doses without advice. What may interact with this medicine? Do not take this medicine with any of the following medications: -metyrapone -mifepristone This medicine may also interact with the following medications: -aminoglutethimide -amphotericin B -aspirin and aspirin-like medicines -barbiturates -certain medicines for diabetes, like glipizide or glyburide -cholestyramine -cholinesterase inhibitors -cyclosporine -digoxin -diuretics -ephedrine -male hormones, like estrogens and birth control pills -isoniazid -ketoconazole -NSAIDS, medicines for pain and inflammation, like ibuprofen or naproxen -phenytoin -rifampin -toxoids -vaccines -warfarin This list may not describe all possible interactions. Give your health care provider a list of all the medicines, herbs, non-prescription drugs, or dietary supplements you use. Also tell them if you smoke, drink alcohol, or use illegal drugs. Some items may interact with your medicine. What should I watch for while using this medicine? Visit your doctor or health care professional for regular checks on your progress. If you are taking this medicine over a  prolonged period, carry an identification card with your name and address, the type and dose of  your medicine, and your doctor's name and address. This medicine may increase your risk of getting an infection. Tell your doctor or health care professional if you are around anyone with measles or chickenpox, or if you develop sores or blisters that do not heal properly. If you are going to have surgery, tell your doctor or health care professional that you have taken this medicine within the last twelve months. Ask your doctor or health care professional about your diet. You may need to lower the amount of salt you eat. This medicine may affect blood sugar levels. If you have diabetes, check with your doctor or health care professional before you change your diet or the dose of your diabetic medicine. What side effects may I notice from receiving this medicine? Side effects that you should report to your doctor or health care professional as soon as possible: -allergic reactions like skin rash, itching or hives, swelling of the face, lips, or tongue -changes in emotions or moods -changes in vision -depressed mood -eye pain -fever or chills, cough, sore throat, pain or difficulty passing urine -increased thirst -swelling of ankles, feet Side effects that usually do not require medical attention (report to your doctor or health care professional if they continue or are bothersome): -confusion, excitement, restlessness -headache -nausea, vomiting -skin problems, acne, thin and shiny skin -trouble sleeping -weight gain This list may not describe all possible side effects. Call your doctor for medical advice about side effects. You may report side effects to FDA at 1-800-FDA-1088. Where should I keep my medicine? Keep out of the reach of children. Store at room temperature between 15 and 30 degrees C (59 and 86 degrees F). Protect from light. Keep container tightly closed. Throw away any unused medicine after the expiration date. NOTE: This sheet is a summary. It may not cover all possible  information. If you have questions about this medicine, talk to your doctor, pharmacist, or health care provider.  2012, [employer] Standard. (08/15/2010 10:57:14 AM)rative colitis -drink more than 3 alcohol-containing drinks per day -drug abuse or addiction -head injury -heart or circulation problems -kidney disease or problems going to the bathroom -liver disease -lung disease, asthma, or breathing problems -an unusual or allergic reaction to acetaminophen, hydrocodone, other opioid analgesics, other medicines, foods, dyes, or preservatives -pregnant or trying to get pregnant -breast-feeding How should I use this medicine? Take this medicine by mouth. Swallow it with a full glass of water. Follow the directions on the prescription label. If the medicine upsets your stomach, take the medicine with food or milk. Do not take more than you are told to take. Talk to your pediatrician regarding the use of this medicine in children. This medicine is not approved for use in children. Overdosage: If you think you have taken too much of this medicine contact a poison control center or emergency room at once. NOTE: This medicine is only for you. Do not share this medicine with others. What if I miss a dose? If you miss a dose, take it as soon as you can. If it is almost time for your next dose, take only that dose. Do not take double or extra doses. What may interact with this medicine? -alcohol or medicines that contain alcohol -antihistamines -isoniazid -medicines for depression, anxiety, or psychotic disturbances -medicines for pain including pentazocine, buprenorphine, butorphanol, nalbuphine, tramadol, and propoxyphene -medicines for sleep -muscle relaxants -naltrexone -  phenobarbital -ritonavir This list may not describe all possible interactions. Give your health care provider a list of all the medicines, herbs, non-prescription drugs, or dietary supplements you use. Also tell them if  you smoke, drink alcohol, or use illegal drugs. Some items may interact with your medicine. What should I watch for while using this medicine? Tell your doctor or health care professional if your pain does not go away, if it gets worse, or if you have new or a different type of pain. You may develop tolerance to the medicine. Tolerance means that you will need a higher dose of the medicine for pain relief. Tolerance is normal and is expected if you take the medicine for a long time. Do not suddenly stop taking your medicine because you may develop a severe reaction. Your body becomes used to the medicine. This does NOT mean you are addicted. Addiction is a behavior related to getting and using a drug for a non-medical reason. If you have pain, you have a medical reason to take pain medicine. Your doctor will tell you how much medicine to take. If your doctor wants you to stop the medicine, the dose will be slowly lowered over time to avoid any side effects. You may get drowsy or dizzy when you first start taking the medicine or change doses. Do not drive, use machinery, or do anything that may be dangerous until you know how the medicine affects you. Stand or sit up slowly. The medicine will cause constipation. Try to have a bowel movement at least every 2 to 3 days. If you do not have a bowel movement for 3 days, call your doctor or health care professional. Too much acetaminophen can be very dangerous. Do not take Tylenol (acetaminophen) or medicines that contain acetaminophen with this medicine. Many non-prescription medicines contain acetaminophen. Always read the labels carefully. What side effects may I notice from receiving this medicine? Side effects that you should report to your doctor or health care professional as soon as possible: -allergic reactions like skin rash, itching or hives, swelling of the face, lips, or tongue -breathing problems -confusion -feeling faint or lightheaded,  falls -stomach pain -yellowing of the eyes or skin Side effects that usually do not require medical attention (report to your doctor or health care professional if they continue or are bothersome): -nausea, vomiting -stomach upset This list may not describe all possible side effects. Call your doctor for medical advice about side effects. You may report side effects to FDA at 1-800-FDA-1088. Where should I keep my medicine? Keep out of the reach of children. This medicine can be abused. Keep your medicine in a safe place to protect it from theft. Do not share this medicine with anyone. Selling or giving away this medicine is dangerous and against the law. Store at room temperature between 15 and 30 degrees C (59 and 86 degrees F). Protect from light. Keep container tightly closed. Throw away any unused medicine after the expiration date. NOTE: This sheet is a summary. It may not cover all possible information. If you have questions about this medicine, talk to your doctor, pharmacist, or health care provider.  2012, Elsevier/Gold Standard. (03/23/2007 10:25:07 AM)

## 2011-06-27 NOTE — Progress Notes (Signed)
Subjective: Interval History:  Objective: Vital signs in last 24 hours: Temp:  [97.4 F (36.3 C)-98.3 F (36.8 C)] 97.4 F (36.3 C) (06/14 0657) Pulse Rate:  [54-69] 69  (06/14 0657) Resp:  [16-20] 20  (06/14 0657) BP: (109-122)/(71-81) 109/71 mmHg (06/14 0657) SpO2:  [95 %-100 %] 95 % (06/14 0657)  Intake/Output from previous day: 06/13 0701 - 06/14 0700 In: 600 [P.O.:600] Out: 2 [Urine:2] Intake/Output this shift:   Nutritional status: General   Lab Results:  [facility] 06/25/11 0626 06/24/11 2249  WBC 5.2 5.8  HGB 15.0 15.7  HCT 44.7 46.6  PLT 346 327  NA 138 139  K 3.8 3.9  CL 103 101  CO2 26 29  GLUCOSE 97 123*  BUN 10 11  CREATININE 0.84 0.94  CALCIUM 9.4 10.0  LABA1C -- --   Lipid Panel No results found for this basename: CHOL,TRIG,HDL,CHOLHDL,VLDL,LDLCALC in the last 72 hours  Studies/Results: Mr [name] Wo Contrast  06/25/2011  *RADIOLOGY REPORT*  Clinical Data: Blurred vision.  Double vision.  Headache.  MRI HEAD WITHOUT AND WITH CONTRAST  Technique:  Multiplanar, multiecho pulse sequences of the brain and surrounding structures were obtained according to standard protocol without and with intravenous contrast  Contrast: 15mL MULTIHANCE GADOBENATE DIMEGLUMINE 529 MG/ML IV SOLN  Comparison: Head CT 06/25/2011  Findings: The brain has a normal appearance on all pulse sequences without evidence of malformation, atrophy, old or acute infarction, mass lesion, hemorrhage, hydrocephalus or extra-axial collection. No evidence of demyelinating disease.  No pituitary mass.  There is minimal mucosal thickening within the right maxillary sinus.  No skull or skull base lesion. The patient has asymmetric fat in the petrous apex on the right, finding that I think  is incidental in this case.  IMPRESSION: Normal appearance of the brain.  Minimal mucosal thickening of the right maxillary sinus.  Original Report Authenticated By: Thomasenia Sales, M.D.   Dg Fluoro Guide Lumbar  Puncture  06/26/2011  *RADIOLOGY REPORT*  Clinical Data: Headaches, multiple cranial nerve palsies  FLUOROSCOPIC GUIDED LUMBAR PUNCTURE:  Technique: Written informed consent for fluoroscopic-guided lumbar puncture was obtained. Patient was placed prone. L4-L5 disc space was localized under fluoroscopy. Skin prepped and draped in usual sterile fashion. Skin and soft tissues anesthetized with 1.25 ml of 1% lidocaine. 22 gauge spinal needle advanced into spinal canal. A tiny amount of CSF was seen within the needle hub but CSF flow could not be established.  Skin at the L3-L4 disc space was then localized under fluoroscopy. Additional local anesthesia utilizing 1.25 ml of 1% lidocaine. 22 gauge spinal needle advanced into the spinal canal. Again, a tiny amount of CSF was seen within the needle but no CSF flow could be established despite tilting the table. Position was assessed with a lateral view and the needle was slightly repositioned. Clear colorless CSF was then encountered with opening pressure of 10 cm H2O. 8 ml of CSF was obtained in four tubes for requested laboratory analysis. Procedure tolerated well by patient without immediate complication.  Fluoroscopy time: 1.2 minutes  IMPRESSION: Fluoroscopic guided lumbar puncture as above.  Original Report Authenticated By: Lollie Marrow, M.D.    Medications:  Scheduled Meds:    . docusate sodium  100 mg Oral BID  . penicillin v potassium  500 mg Oral QID  . predniSONE  50 mg Oral BID WC  . sodium chloride  3 mL Intravenous Q12H   Continuous Infusions:  PRN Meds:.acetaminophen, acetaminophen, albuterol, HYDROcodone-acetaminophen, ondansetron (ZOFRAN)  IV, ondansetron   Assessment/Plan: See dict   LOS: 3 days   Jibreel Fedewa

## 2011-06-28 LAB — CRYPTOCOCCAL ANTIGEN, CSF: Crypto Ag: NEGATIVE

## 2011-06-30 LAB — CSF CULTURE W GRAM STAIN

## 2011-07-04 LAB — MYASTHENIA GRAVIS PANEL 2
Acety choline binding ab: <0.3 nmol/L
Acetylchol Block Ab: <15 % of inhibition (ref <15)

## 2011-07-27 ENCOUNTER — Emergency Department (HOSPITAL_COMMUNITY)
Admission: EM | Admit: 2011-07-27 | Discharge: 2011-07-27 | Disposition: A | Discharge status: Home / Self Care | Payer: Self-pay | Attending: Emergency Medicine | Admitting: Emergency Medicine

## 2011-07-27 ENCOUNTER — Encounter (HOSPITAL_COMMUNITY): Payer: Self-pay

## 2011-07-27 ENCOUNTER — Emergency Department (HOSPITAL_COMMUNITY): Payer: Self-pay

## 2011-07-27 DIAGNOSIS — H538 Other visual disturbances: Secondary | ICD-10-CM | POA: Insufficient documentation

## 2011-07-27 DIAGNOSIS — F17200 Nicotine dependence, unspecified, uncomplicated: Secondary | ICD-10-CM | POA: Insufficient documentation

## 2011-07-27 DIAGNOSIS — R51 Headache: Secondary | ICD-10-CM | POA: Insufficient documentation

## 2011-07-27 DIAGNOSIS — H532 Diplopia: Secondary | ICD-10-CM | POA: Insufficient documentation

## 2011-07-27 DIAGNOSIS — H53149 Visual discomfort, unspecified: Secondary | ICD-10-CM | POA: Insufficient documentation

## 2011-07-27 HISTORY — DX: Sixth (abducent) nerve palsy, unspecified eye: H49.20

## 2011-07-27 LAB — COMPREHENSIVE METABOLIC PANEL
Albumin: 3.7 g/dL (ref 3.5–5.2)
BUN: 12 mg/dL (ref 6–23)
Calcium: 9.5 mg/dL (ref 8.4–10.5)
Creatinine, Ser: 1.02 mg/dL (ref 0.50–1.35)
GFR calc Af Amer: >90 mL/min (ref >90)
Glucose, Bld: 99 mg/dL (ref 70–99)
Total Protein: 7.1 g/dL (ref 6.0–8.3)

## 2011-07-27 LAB — CBC WITH DIFFERENTIAL/PLATELET
Basophils Relative: 0 % (ref 0–1)
Eosinophils Absolute: 0.2 10*3/uL (ref 0.0–0.7)
Eosinophils Relative: 3 % (ref 0–5)
Hemoglobin: 15.2 g/dL (ref 13.0–17.0)
Lymphs Abs: 2.1 10*3/uL (ref 0.7–4.0)
MCH: 31.4 pg (ref 26.0–34.0)
MCHC: 34.2 g/dL (ref 30.0–36.0)
MCV: 91.7 fL (ref 78.0–100.0)
Monocytes Relative: 8 % (ref 3–12)
RBC: 4.84 MIL/uL (ref 4.22–5.81)

## 2011-07-27 MED ORDER — HYDROCODONE-ACETAMINOPHEN 5-325 MG PO TABS: 1.0000 | ORAL_TABLET | Freq: Four times a day (QID) | ORAL | Status: AC | PRN

## 2011-07-27 MED ORDER — METHYLPREDNISOLONE SODIUM SUCC 125 MG IJ SOLR
125.0000 mg | Freq: Once | INTRAMUSCULAR | Status: AC
  Administered 2011-07-27: 125 mg via INTRAMUSCULAR
  Filled 2011-07-27: qty 2

## 2011-07-27 MED ORDER — PREDNISONE 10 MG PO TABS: 20.0000 mg | ORAL_TABLET | Freq: Every day | ORAL | Status: DC

## 2011-07-27 NOTE — ED Notes (Signed)
Discharge instructions reviewed with pt, questions answered. Pt verbalized understanding.  

## 2011-07-27 NOTE — ED Notes (Signed)
MD at bedside. 

## 2011-07-27 NOTE — ED Provider Notes (Signed)
History  This chart was scribed for Benny Lennert, MD by Ladona Ridgel Day. This patient was seen in room APA10/APA10 and the patient's care was started at 1145.  CSN: [id number]  Arrival date & time 07/27/11  1145   First MD Initiated Contact with Patient 07/27/11 1213      Chief Complaint  Patient presents with  . Blurred Vision    Patient is a 36 y.o. male presenting with eye problem. The history is provided by the patient. No language interpreter was used.  Eye Problem  This is a recurrent problem. Episode onset: Recurrent over past week.  The problem occurs constantly. The problem has been gradually worsening. There is pain in both eyes. There was no injury mechanism. The patient is experiencing no pain. There is no history of trauma to the eye. Associated symptoms include blurred vision ("Seeing double" worse when looking to the right. ). Pertinent negatives include no numbness and no discharge. Treatments tried: Was hospitlized here June 11th and told he had 6th nerve palsy.    Colton Sanders is a 36 y.o. male who presents to the Emergency Department complaining of gradually worsening visual disturbance/seeing double vision which began about a month ago and was shortly resolved after being hospitalized here but his symptoms returned gradually within the past week. He states his diagnosis when hospitalized was 6th nerve palsy and was given prednisone which improved his vision symptoms. He states modifying factors as looking to his far right which aggravate his blurry vision. He states other symptoms such as HA in his jaw/eye area have returned the past week as associated symptoms. He denies any other illnesses or injuries at this time.  He is followed by his PCP Dr. Bryson Corona.   Past Medical History  Diagnosis Date  . Migraine   . Sixth nerve palsy     Past Surgical History  Procedure Date  . Tooth extraction 05/2011    No family history on file.  History  Substance Use Topics    . Smoking status: Current Everyday Smoker    Types: Cigars  . Smokeless tobacco: Not on file  . Alcohol Use: 1.8 oz/week    3 Cans of beer per week     occ      Review of Systems  Constitutional: Negative for fatigue.  HENT: Negative for congestion, sinus pressure and ear discharge.   Eyes: Positive for blurred vision ("Seeing double" worse when looking to the right. ). Negative for discharge.       He states blurry/double vision worse when looking to his far right.   Respiratory: Negative for cough and shortness of breath.   Cardiovascular: Negative for chest pain.  Gastrointestinal: Negative for abdominal pain and diarrhea.  Genitourinary: Negative for frequency and hematuria.  Musculoskeletal: Negative for back pain.  Skin: Negative for rash.  Neurological: Positive for headaches (HA in jaw and right eye area.). Negative for seizures and numbness.  Hematological: Negative.   Psychiatric/Behavioral: Negative for hallucinations.  All other systems reviewed and are negative.    Allergies  Bee venom  Home Medications   Current Outpatient Rx  Name Route Sig Dispense Refill  . IBUPROFEN 200 MG PO TABS Oral Take 800 mg by mouth as needed. For pain    . PREDNISONE 10 MG PO TABS  Take 5 tablets twice per day in the morning and evening for 2 days, then 6 tablets once daily for 2 days, then 5 tablets once daily for 2 days, then  4 tablets once daily for 2 days, then 3 tablets daily for 2 days, then 2 tablets daily for 2 days, then 1 tablet daily for 2 days 62 tablet 0    Triage Vitals: BP 128/69  Pulse 89  Temp 98.7 F (37.1 C) (Oral)  Resp 20  Ht 6\' 3"  (1.905 m)  Wt 165 lb (74.844 kg)  BMI 20.62 kg/m2  SpO2 99%  Physical Exam  Nursing note and vitals reviewed. Constitutional: He is oriented to person, place, and time. He appears well-developed and well-nourished.  HENT:  Head: Normocephalic and atraumatic.  Eyes: Conjunctivae and EOM are normal. Pupils are equal,  round, and reactive to light. Right eye exhibits no discharge. Left eye exhibits no discharge. No scleral icterus.       Eyes appear normal and bilateral pupils reactive to light.   Neck: No tracheal deviation present.  Cardiovascular:  No murmur heard. Pulmonary/Chest: Effort normal. No respiratory distress.  Musculoskeletal: Normal range of motion.  Neurological: He is alert and oriented to person, place, and time.  Skin: Skin is warm.  Psychiatric: He has a normal mood and affect.    ED Course  Procedures (including critical care time) DIAGNOSTIC STUDIES: Oxygen Saturation is 99% on room air, normal by my interpretation.    COORDINATION OF CARE: At 1221 Discussed treatment plan with patient which includes head CT, and blood work. Patient agrees.   Labs Reviewed - No data to display No results found.   No diagnosis found.    MDM   The chart was scribed for me under my direct supervision.  I personally performed the history, physical, and medical decision making and all procedures in the evaluation of this patient..   Headache with possible recurrent 6 nerve palsy.  Will start steroids again and follow up with neurologist  Benny Lennert, MD 07/27/11 1431

## 2011-07-27 NOTE — ED Notes (Signed)
Blurred vision for last week, was in hospital for same in June. Was told he had 6th nerve palsy

## 2011-08-08 LAB — AFB CULTURE WITH SMEAR (NOT AT ARMC)

## 2011-10-07 ENCOUNTER — Encounter (HOSPITAL_COMMUNITY): Payer: Self-pay

## 2011-10-07 ENCOUNTER — Emergency Department (HOSPITAL_COMMUNITY)
Admission: EM | Admit: 2011-10-07 | Discharge: 2011-10-07 | Disposition: A | Discharge status: Home / Self Care | Payer: Self-pay | Attending: Emergency Medicine | Admitting: Emergency Medicine

## 2011-10-07 DIAGNOSIS — F17200 Nicotine dependence, unspecified, uncomplicated: Secondary | ICD-10-CM | POA: Insufficient documentation

## 2011-10-07 DIAGNOSIS — Z79899 Other long term (current) drug therapy: Secondary | ICD-10-CM | POA: Insufficient documentation

## 2011-10-07 DIAGNOSIS — Z91038 Other insect allergy status: Secondary | ICD-10-CM | POA: Insufficient documentation

## 2011-10-07 DIAGNOSIS — H4920 Sixth [abducent] nerve palsy, unspecified eye: Secondary | ICD-10-CM | POA: Insufficient documentation

## 2011-10-07 MED ORDER — PREDNISONE 10 MG PO TABS: ORAL_TABLET | ORAL | Status: DC

## 2011-10-07 MED ORDER — GABAPENTIN 300 MG PO CAPS: 300.0000 mg | ORAL_CAPSULE | Freq: Two times a day (BID) | ORAL | Status: DC

## 2011-10-07 MED ORDER — GABAPENTIN 400 MG PO CAPS
400.0000 mg | ORAL_CAPSULE | Freq: Once | ORAL | Status: AC
  Administered 2011-10-07: 400 mg via ORAL
  Filled 2011-10-07: qty 1

## 2011-10-07 MED ORDER — PREDNISONE 20 MG PO TABS
60.0000 mg | ORAL_TABLET | Freq: Once | ORAL | Status: AC
  Administered 2011-10-07: 60 mg via ORAL
  Filled 2011-10-07: qty 3

## 2011-10-07 MED ORDER — KETOROLAC TROMETHAMINE 60 MG/2ML IM SOLN
60.0000 mg | Freq: Once | INTRAMUSCULAR | Status: AC
  Administered 2011-10-07: 60 mg via INTRAMUSCULAR
  Filled 2011-10-07: qty 2

## 2011-10-07 MED ORDER — ACYCLOVIR 800 MG PO TABS
800.0000 mg | ORAL_TABLET | Freq: Once | ORAL | Status: AC
  Administered 2011-10-07: 800 mg via ORAL
  Filled 2011-10-07: qty 1

## 2011-10-07 MED ORDER — ACYCLOVIR 800 MG PO TABS: 800.0000 mg | ORAL_TABLET | Freq: Every day | ORAL | Status: DC

## 2011-10-07 NOTE — ED Notes (Signed)
Pt states he had bells palsy about a month ago and was treated for same, states the same thing has returned for last several days, only with numbness to right side of face.

## 2011-10-07 NOTE — ED Provider Notes (Addendum)
History     CSN: [id number]  Arrival date & time 10/07/11  0227   First MD Initiated Contact with Patient 10/07/11 [phone]      Chief Complaint  Patient presents with  . rt side face numb     (Consider location/radiation/quality/duration/timing/severity/associated sxs/prior treatment) HPI  Colton Sanders is a 36 y.o. male who presents to the Emergency Department complaining of recurrent symptoms of Bells palsy on the right side. He had a Bell's palsy 06/2011 with recurrence of symptoms after finishing his first course of prednisone. It has been about three weeks since he finished his second course of prednisone and he is now experiencing numbness, pain and drooping to the right side of his face.He has taken no medicines. He was seen by neurologist, Dr. Gerilyn Pilgrim while he was hospitalized. He made one follow up visit and has not returned due to financial constraints.      Past Medical History  Diagnosis Date  . Migraine   . Sixth nerve palsy     Past Surgical History  Procedure Date  . Tooth extraction 05/2011    No family history on file.  History  Substance Use Topics  . Smoking status: Current Every Day Smoker    Types: Cigars  . Smokeless tobacco: Not on file  . Alcohol Use: 1.8 oz/week    3 Cans of beer per week     occ      Review of Systems  Constitutional: Negative for fever.       10 Systems reviewed and are negative for acute change except as noted in the HPI.  HENT: Negative for congestion.        Right sided facial numbness  Eyes: Positive for visual disturbance. Negative for discharge and redness.  Respiratory: Negative for cough and shortness of breath.   Cardiovascular: Negative for chest pain.  Gastrointestinal: Negative for vomiting and abdominal pain.  Musculoskeletal: Negative for back pain.  Skin: Negative for rash.  Neurological: Negative for syncope, numbness and headaches.  Psychiatric/Behavioral:       No behavior change.    Allergies   Bee venom  Home Medications   Current Outpatient Rx  Name Route Sig Dispense Refill  . HYDROCODONE-ACETAMINOPHEN 5-325 MG PO TABS Oral Take 1 tablet by mouth every 4 (four) hours as needed.    . ASPIRIN 500 MG PO TABS Oral Take 500 mg by mouth every 6 (six) hours as needed. Pain    . PREDNISONE 10 MG PO TABS Oral Take 2 tablets (20 mg total) by mouth daily. 15 tablet 0    BP 145/90  Pulse 56  Temp 97.5 F (36.4 C) (Oral)  Resp 20  Ht 6\' 3"  (1.905 m)  Wt 165 lb (74.844 kg)  BMI 20.62 kg/m2  SpO2 98%  Physical Exam  Nursing note and vitals reviewed. Constitutional: He is oriented to person, place, and time. He appears well-developed and well-nourished.       Awake, alert, nontoxic appearance.  HENT:  Head: Normocephalic and atraumatic.  Right Ear: External ear normal.  Left Ear: External ear normal.  Nose: Nose normal.  Mouth/Throat: Oropharynx is clear and moist.  Eyes: Conjunctivae normal are normal. Pupils are equal, round, and reactive to light. Right eye exhibits no discharge. Left eye exhibits no discharge.  Neck: Normal range of motion. Neck supple.  Cardiovascular: Normal heart sounds.   Pulmonary/Chest: Effort normal and breath sounds normal. He exhibits no tenderness.  Abdominal: Soft. There is no tenderness. There  is no rebound.  Musculoskeletal: Normal range of motion. He exhibits no tenderness.       Baseline ROM, no obvious new focal weakness.  Neurological: He is alert and oriented to person, place, and time.       Mental status and motor strength appears baseline for patient and situation.     Reduced sensation V1 distribution on the right side.  neck is supple. Head is normocephalic, atraumatic.  He has good strength of orbicularis oculi muscles bilaterally.Llateral rectus weakness on the right side. Vertical  All the eye movements are good.   Lower facial muscles and good. Tongue is midline.   Skin: No rash noted.  Psychiatric: He has a normal mood and  affect.    ED Course  Procedures (including critical care time)  Labs Reviewed - No data to display No results found.   No diagnosis found.    MDM  Patient with h/o Bells palsy here with recurrent numbness to right side of face. Initiated steroid, anti viral, neurontin.Patient to follow up with neurologist. Pt stable in ED with no significant deterioration in condition.The patient appears reasonably screened and/or stabilized for discharge and I doubt any other medical condition or other [facility] requiring further screening, evaluation, or treatment in the ED at this time prior to discharge.  MDM Reviewed: nursing note, previous chart and vitals Reviewed previous: CT scan and MRI           Nicoletta Dress. Colon Branch, MD 10/07/11 1610  Nicoletta Dress. Colon Branch, MD 10/07/11 [phone]

## 2011-10-07 NOTE — ED Notes (Signed)
Pt reporting diagnosis of bells palsy a couple months ago.  Reporting similar symptoms now.  Reports numbness and tingling on right side of face.  No distress noted.

## 2011-10-23 ENCOUNTER — Other Ambulatory Visit: Payer: Self-pay | Admitting: Neurology

## 2011-10-23 ENCOUNTER — Ambulatory Visit (HOSPITAL_COMMUNITY)
Admission: RE | Admit: 2011-10-23 | Discharge: 2011-10-23 | Disposition: A | Discharge status: Home / Self Care | Payer: Self-pay | Source: Ambulatory Visit | Attending: Neurology | Admitting: Neurology

## 2011-10-23 DIAGNOSIS — R0600 Dyspnea, unspecified: Secondary | ICD-10-CM

## 2011-10-23 DIAGNOSIS — R0609 Other forms of dyspnea: Secondary | ICD-10-CM | POA: Insufficient documentation

## 2011-10-23 DIAGNOSIS — R0989 Other specified symptoms and signs involving the circulatory and respiratory systems: Secondary | ICD-10-CM | POA: Insufficient documentation

## 2011-10-23 DIAGNOSIS — F17200 Nicotine dependence, unspecified, uncomplicated: Secondary | ICD-10-CM | POA: Insufficient documentation

## 2012-07-23 ENCOUNTER — Encounter (HOSPITAL_COMMUNITY): Payer: Self-pay | Admitting: Emergency Medicine

## 2012-07-23 ENCOUNTER — Emergency Department (HOSPITAL_COMMUNITY)
Admission: EM | Admit: 2012-07-23 | Discharge: 2012-07-23 | Disposition: A | Discharge status: Home / Self Care | Payer: Self-pay | Attending: Emergency Medicine | Admitting: Emergency Medicine

## 2012-07-23 ENCOUNTER — Emergency Department (HOSPITAL_COMMUNITY): Payer: Self-pay

## 2012-07-23 DIAGNOSIS — G43909 Migraine, unspecified, not intractable, without status migrainosus: Secondary | ICD-10-CM | POA: Insufficient documentation

## 2012-07-23 DIAGNOSIS — R109 Unspecified abdominal pain: Secondary | ICD-10-CM | POA: Insufficient documentation

## 2012-07-23 DIAGNOSIS — K2970 Gastritis, unspecified, without bleeding: Secondary | ICD-10-CM | POA: Insufficient documentation

## 2012-07-23 DIAGNOSIS — K2990 Gastroduodenitis, unspecified, without bleeding: Secondary | ICD-10-CM | POA: Insufficient documentation

## 2012-07-23 DIAGNOSIS — Z8669 Personal history of other diseases of the nervous system and sense organs: Secondary | ICD-10-CM | POA: Insufficient documentation

## 2012-07-23 DIAGNOSIS — Z79899 Other long term (current) drug therapy: Secondary | ICD-10-CM | POA: Insufficient documentation

## 2012-07-23 DIAGNOSIS — F17200 Nicotine dependence, unspecified, uncomplicated: Secondary | ICD-10-CM | POA: Insufficient documentation

## 2012-07-23 LAB — CBC WITH DIFFERENTIAL/PLATELET
Basophils Relative: 0 % (ref 0–1)
Eosinophils Absolute: 0.1 10*3/uL (ref 0.0–0.7)
Eosinophils Relative: 0 % (ref 0–5)
Lymphs Abs: 2.7 10*3/uL (ref 0.7–4.0)
MCH: 34.2 pg — ABNORMAL HIGH (ref 26.0–34.0)
MCHC: 35.3 g/dL (ref 30.0–36.0)
MCV: 96.8 fL (ref 78.0–100.0)
Platelets: 326 10*3/uL (ref 150–400)
RBC: 4.42 MIL/uL (ref 4.22–5.81)

## 2012-07-23 LAB — COMPREHENSIVE METABOLIC PANEL
ALT: 14 U/L (ref 0–53)
AST: 24 U/L (ref 0–37)
Albumin: 4 g/dL (ref 3.5–5.2)
Alkaline Phosphatase: 109 U/L (ref 39–117)
CO2: 20 mEq/L (ref 19–32)
Chloride: 96 mEq/L (ref 96–112)
Creatinine, Ser: 1.04 mg/dL (ref 0.50–1.35)
Potassium: 3.7 mEq/L (ref 3.5–5.1)
Sodium: 136 mEq/L (ref 135–145)
Total Bilirubin: 0.6 mg/dL (ref 0.3–1.2)

## 2012-07-23 LAB — LIPASE, BLOOD: Lipase: 18 U/L (ref 11–59)

## 2012-07-23 MED ORDER — HYDROMORPHONE HCL PF 1 MG/ML IJ SOLN
1.0000 mg | Freq: Once | INTRAMUSCULAR | Status: AC
  Administered 2012-07-23: 1 mg via INTRAVENOUS
  Filled 2012-07-23: qty 1

## 2012-07-23 MED ORDER — SUCRALFATE 1 GM/10ML PO SUSP
1.0000 g | Freq: Three times a day (TID) | ORAL | Status: DC
  Filled 2012-07-23 (×6): qty 10

## 2012-07-23 MED ORDER — SODIUM CHLORIDE 0.9 % IV BOLUS (SEPSIS)
1000.0000 mL | Freq: Once | INTRAVENOUS | Status: AC
  Administered 2012-07-23: 1000 mL via INTRAVENOUS

## 2012-07-23 MED ORDER — RANITIDINE HCL 150 MG PO CAPS: 150.0000 mg | ORAL_CAPSULE | Freq: Two times a day (BID) | ORAL | Status: DC

## 2012-07-23 MED ORDER — GI COCKTAIL ~~LOC~~
30.0000 mL | Freq: Once | ORAL | Status: AC
  Administered 2012-07-23: 30 mL via ORAL
  Filled 2012-07-23: qty 30

## 2012-07-23 MED ORDER — SUCRALFATE 1 GM/10ML PO SUSP
1.0000 g | Freq: Once | ORAL | Status: DC
  Filled 2012-07-23: qty 10

## 2012-07-23 MED ORDER — TRAMADOL HCL 50 MG PO TABS: 50.0000 mg | ORAL_TABLET | Freq: Four times a day (QID) | ORAL | Status: DC | PRN

## 2012-07-23 MED ORDER — ONDANSETRON HCL 4 MG/2ML IJ SOLN
8.0000 mg | Freq: Once | INTRAMUSCULAR | Status: AC
  Administered 2012-07-23: 8 mg via INTRAVENOUS
  Filled 2012-07-23: qty 4

## 2012-07-23 MED ORDER — IOHEXOL 300 MG/ML  SOLN
100.0000 mL | Freq: Once | INTRAMUSCULAR | Status: AC | PRN
  Administered 2012-07-23: 100 mL via INTRAVENOUS

## 2012-07-23 MED ORDER — PROMETHAZINE HCL 25 MG PO TABS: 25.0000 mg | ORAL_TABLET | Freq: Four times a day (QID) | ORAL | Status: DC | PRN

## 2012-07-23 MED ORDER — PANTOPRAZOLE SODIUM 40 MG IV SOLR
40.0000 mg | Freq: Once | INTRAVENOUS | Status: AC
  Administered 2012-07-23: 40 mg via INTRAVENOUS
  Filled 2012-07-23: qty 40

## 2012-07-23 MED ORDER — IOHEXOL 300 MG/ML  SOLN
50.0000 mL | Freq: Once | INTRAMUSCULAR | Status: AC | PRN
  Administered 2012-07-23: 50 mL via ORAL

## 2012-07-23 NOTE — ED Notes (Signed)
Patient c/o vomiting since 0130.

## 2012-07-23 NOTE — ED Provider Notes (Signed)
History    CSN: [id number] Arrival date & time 07/23/12  7425  First MD Initiated Contact with Patient 07/23/12 [phone]     Chief Complaint  Patient presents with  . Emesis   HPI Colton Sanders is a 37 y.o. male presenting with nausea vomiting and epigastric pain since early this morning. Patient was able to the dinner at 9:00 last night, he said he started vomiting about 1:30. Patient's pain is aching, constant, associated with nausea vomiting, has not taken anything for it. Patient says he's had headaches recently for which she's been taking "a lot" of Tylenol, Goody's powders. Denies any melena, hematochezia lower abdominal pain, patient still has his appendix and gallbladder. No chest pain, shortness of breath, fevers or chills.  Uncertain of whether pain or nausea started first.  Past Medical History  Diagnosis Date  . Migraine   . Sixth nerve palsy    Past Surgical History  Procedure Laterality Date  . Tooth extraction  05/2011   No family history on file. History  Substance Use Topics  . Smoking status: Current Every Day Smoker    Types: Cigars  . Smokeless tobacco: Not on file  . Alcohol Use: 1.8 oz/week    3 Cans of beer per week     Comment: occ    Review of Systems At least 10pt or greater review of systems completed and are negative except where specified in the HPI.  Allergies  Bee venom  Home Medications   Current Outpatient Rx  Name  Route  Sig  Dispense  Refill  . aspirin 500 MG tablet   Oral   Take 500 mg by mouth every 6 (six) hours as needed. Pain         . acyclovir (ZOVIRAX) 800 MG tablet   Oral   Take 1 tablet (800 mg total) by mouth 5 (five) times daily.   50 tablet   3   . gabapentin (NEURONTIN) 300 MG capsule   Oral   Take 1 capsule (300 mg total) by mouth 2 (two) times daily.   20 capsule   0   . HYDROcodone-acetaminophen (NORCO/VICODIN) 5-325 MG per tablet   Oral   Take 1 tablet by mouth every 4 (four) hours as needed.          . predniSONE (DELTASONE) 10 MG tablet   Oral   Take 2 tablets (20 mg total) by mouth daily.   15 tablet   0   . predniSONE (DELTASONE) 10 MG tablet      2 tablets daily x 4 days then one tablet daily x 4 days   12 tablet   0    BP 118/58  Pulse 98  Temp(Src) 97.9 F (36.6 C) (Oral)  Resp 20  Ht 6\' 3"  (1.905 m)  Wt 155 lb (70.308 kg)  BMI 19.37 kg/m2  SpO2 99% Physical Exam  Nursing notes reviewed.  Electronic medical record reviewed. VITAL SIGNS:   Filed Vitals:   07/23/12 0638  BP: 118/58  Pulse: 98  Temp: 97.9 F (36.6 C)  TempSrc: Oral  Resp: 20  Height: 6\' 3"  (1.905 m)  Weight: 155 lb (70.308 kg)  SpO2: 99%   CONSTITUTIONAL: Awake, oriented, appears non-toxic, patient vomiting. HENT: Atraumatic, normocephalic, oral mucosa pink and moist, airway patent. Nares patent without drainage. External ears normal. EYES: Conjunctiva clear, EOMI, PERRLA NECK: Trachea midline, non-tender, supple CARDIOVASCULAR: Normal heart rate, Normal rhythm, No murmurs, rubs, gallops PULMONARY/CHEST: Clear to auscultation, no  rhonchi, wheezes, or rales. Symmetrical breath sounds. Non-tender. ABDOMINAL: Non-distended, soft, tender in the epigastrium without rebound or guarding, increased bowel sounds.  Pt also has some TTP in the RLQ. NEUROLOGIC: Non-focal, moving all four extremities, no gross sensory or motor deficits. EXTREMITIES: No clubbing, cyanosis, or edema SKIN: Warm, Dry, No erythema, No rash  ED Course  Procedures (including critical care time) Labs Reviewed  COMPREHENSIVE METABOLIC PANEL - Abnormal; Notable for the following:    Glucose, Bld 111 (*)    GFR calc non Af Amer 90 (*)    All other components within normal limits  CBC WITH DIFFERENTIAL - Abnormal; Notable for the following:    WBC 24.9 (*)    MCH 34.2 (*)    Neutrophils Relative % 83 (*)    Neutro Abs 20.7 (*)    Lymphocytes Relative 11 (*)    Monocytes Absolute 1.4 (*)    All other components within  normal limits  LIPASE, BLOOD   No results found. No diagnosis found.  MDM  Colton Sanders is a 37 y.o. male presenting with epigastric pain, N/V and some RLQ pain on exam.  WBC significant at 24.9 - more than I would expect from stress/vomiting.  In context of RLQ pain - will obtain CT of abdomen and pelvis to exclude appendicitis.  Also considering Mallory-Weiss tears (less likely with no hematemesis) and more likely gastritis secondary to EtOH and NSAID use yesterday.  Liver and pancreas do not seem to be affected, and GB not likely involved either.   Dr. Estell Harpin taking over care of pt, please see his note for final disposition and rest of ER stay.    Jones Skene, MD 07/23/12 [phone]

## 2012-10-01 ENCOUNTER — Encounter (HOSPITAL_COMMUNITY): Payer: Self-pay | Admitting: *Deleted

## 2012-10-01 ENCOUNTER — Emergency Department (HOSPITAL_COMMUNITY)
Admission: EM | Admit: 2012-10-01 | Discharge: 2012-10-01 | Disposition: A | Discharge status: Home / Self Care | Payer: Self-pay | Attending: Emergency Medicine | Admitting: Emergency Medicine

## 2012-10-01 DIAGNOSIS — L089 Local infection of the skin and subcutaneous tissue, unspecified: Secondary | ICD-10-CM | POA: Insufficient documentation

## 2012-10-01 DIAGNOSIS — Z8669 Personal history of other diseases of the nervous system and sense organs: Secondary | ICD-10-CM | POA: Insufficient documentation

## 2012-10-01 DIAGNOSIS — F17200 Nicotine dependence, unspecified, uncomplicated: Secondary | ICD-10-CM | POA: Insufficient documentation

## 2012-10-01 DIAGNOSIS — Z8679 Personal history of other diseases of the circulatory system: Secondary | ICD-10-CM | POA: Insufficient documentation

## 2012-10-01 DIAGNOSIS — Z79899 Other long term (current) drug therapy: Secondary | ICD-10-CM | POA: Insufficient documentation

## 2012-10-01 LAB — CBC WITH DIFFERENTIAL/PLATELET
Basophils Relative: 0 % (ref 0–1)
Eosinophils Absolute: 0.1 10*3/uL (ref 0.0–0.7)
Eosinophils Relative: 1 % (ref 0–5)
Hemoglobin: 16.1 g/dL (ref 13.0–17.0)
Lymphs Abs: 1.8 10*3/uL (ref 0.7–4.0)
MCH: 33.5 pg (ref 26.0–34.0)
MCHC: 34.2 g/dL (ref 30.0–36.0)
MCV: 97.9 fL (ref 78.0–100.0)
Monocytes Absolute: 1.2 10*3/uL — ABNORMAL HIGH (ref 0.1–1.0)
Monocytes Relative: 8 % (ref 3–12)
Neutrophils Relative %: 78 % — ABNORMAL HIGH (ref 43–77)
RBC: 4.81 MIL/uL (ref 4.22–5.81)

## 2012-10-01 LAB — COMPREHENSIVE METABOLIC PANEL
Albumin: 3.9 g/dL (ref 3.5–5.2)
Alkaline Phosphatase: 116 U/L (ref 39–117)
BUN: 10 mg/dL (ref 6–23)
Calcium: 9.8 mg/dL (ref 8.4–10.5)
Creatinine, Ser: 0.81 mg/dL (ref 0.50–1.35)
GFR calc Af Amer: >90 mL/min (ref >90)
Glucose, Bld: 106 mg/dL — ABNORMAL HIGH (ref 70–99)
Total Protein: 8.1 g/dL (ref 6.0–8.3)

## 2012-10-01 MED ORDER — HYDROCODONE-ACETAMINOPHEN 5-325 MG PO TABS: 1.0000 | ORAL_TABLET | Freq: Four times a day (QID) | ORAL | Status: DC | PRN

## 2012-10-01 MED ORDER — SULFAMETHOXAZOLE-TRIMETHOPRIM 800-160 MG PO TABS: 1.0000 | ORAL_TABLET | Freq: Two times a day (BID) | ORAL | Status: DC

## 2012-10-01 MED ORDER — ONDANSETRON HCL 4 MG/2ML IJ SOLN
4.0000 mg | Freq: Once | INTRAMUSCULAR | Status: AC
  Administered 2012-10-01: 4 mg via INTRAVENOUS
  Filled 2012-10-01: qty 2

## 2012-10-01 MED ORDER — HYDROMORPHONE HCL PF 1 MG/ML IJ SOLN
1.0000 mg | Freq: Once | INTRAMUSCULAR | Status: AC
  Administered 2012-10-01: 1 mg via INTRAVENOUS
  Filled 2012-10-01: qty 1

## 2012-10-01 MED ORDER — DEXTROSE 5 % IV SOLN
1.0000 g | Freq: Once | INTRAVENOUS | Status: AC
  Administered 2012-10-01: 1 g via INTRAVENOUS
  Filled 2012-10-01: qty 10

## 2012-10-01 NOTE — ED Notes (Signed)
Patient with no complaints at this time. Respirations even and unlabored. Skin warm/dry. Discharge instructions reviewed with patient at this time. Patient given opportunity to voice concerns/ask questions. IV removed per policy and band-aid applied to site. Patient discharged at this time and left Emergency Department with steady gait.  

## 2012-10-01 NOTE — ED Notes (Addendum)
Furuncle in axilla noted a week ago w/white cap.  Patient squeezed site and expressed small amount of thick, white fluid at that time.  No drainage noted at present.  States he has had low grade fevers (99) at home.

## 2012-10-01 NOTE — ED Provider Notes (Signed)
CSN: [id number]     Arrival date & time 10/01/12  1236 History   First MD Initiated Contact with Patient 10/01/12 1410     Chief Complaint  Patient presents with  . Abscess   (Consider location/radiation/quality/duration/timing/severity/associated sxs/prior Treatment) Patient is a 37 y.o. male presenting with rash. The history is provided by the patient (pt complains of a skin infection under left axilla). No language interpreter was used.  Rash Location: left arm pit. Quality: not blistering   Severity:  Moderate Onset quality:  Gradual Timing:  Constant Progression:  Improving Chronicity:  New Context: not animal contact     Past Medical History  Diagnosis Date  . Migraine   . Sixth nerve palsy    Past Surgical History  Procedure Laterality Date  . Tooth extraction  05/2011   History reviewed. No pertinent family history. History  Substance Use Topics  . Smoking status: Current Every Day Smoker    Types: Cigars  . Smokeless tobacco: Not on file  . Alcohol Use: 1.8 oz/week    3 Cans of beer per week     Comment: occ    Review of Systems  Skin: Positive for rash.    Allergies  Bee venom  Home Medications   Current Outpatient Rx  Name  Route  Sig  Dispense  Refill  . HYDROcodone-acetaminophen (NORCO/VICODIN) 5-325 MG per tablet   Oral   Take 1 tablet by mouth 2 (two) times daily as needed for pain.         Marland Kitchen ibuprofen (ADVIL,MOTRIN) 200 MG tablet   Oral   Take 400 mg by mouth every 6 (six) hours as needed for pain.         Marland Kitchen HYDROcodone-acetaminophen (NORCO/VICODIN) 5-325 MG per tablet   Oral   Take 1 tablet by mouth every 6 (six) hours as needed for pain.   20 tablet   0   . sulfamethoxazole-trimethoprim (SEPTRA DS) 800-160 MG per tablet   Oral   Take 1 tablet by mouth 2 (two) times daily.   20 tablet   0    BP 134/77  Pulse 79  Temp(Src) 98.8 F (37.1 C) (Oral)  Resp 20  Ht 6\' 3"  (1.905 m)  Wt 160 lb (72.576 kg)  BMI 20 kg/m2  SpO2  98% Physical Exam  Constitutional: He is oriented to person, place, and time. He appears well-developed.  HENT:  Head: Normocephalic.  Eyes: Conjunctivae and EOM are normal. No scleral icterus.  Neck: Neck supple. No thyromegaly present.  Cardiovascular: Normal rate and regular rhythm.  Exam reveals no gallop and no friction rub.   No murmur heard. Pulmonary/Chest: No stridor. He has no wheezes. He has no rales. He exhibits no tenderness.  Abdominal: He exhibits no distension. There is no tenderness. There is no rebound.  Musculoskeletal: Normal range of motion. He exhibits no edema.  Infection under left axilla  Lymphadenopathy:    He has no cervical adenopathy.  Neurological: He is oriented to person, place, and time. Coordination normal.  Skin: No rash noted. No erythema.  Psychiatric: He has a normal mood and affect. His behavior is normal.    ED Course  Procedures (including critical care time) Labs Review Labs Reviewed  CBC WITH DIFFERENTIAL - Abnormal; Notable for the following:    WBC 13.6 (*)    Neutrophils Relative % 78 (*)    Neutro Abs 10.6 (*)    Monocytes Absolute 1.2 (*)    All other components within  normal limits  COMPREHENSIVE METABOLIC PANEL - Abnormal; Notable for the following:    Sodium 133 (*)    Glucose, Bld 106 (*)    All other components within normal limits   Imaging Review No results found.  MDM   1. Skin infection        Benny Lennert, MD 10/01/12 [phone]

## 2012-10-01 NOTE — ED Notes (Addendum)
Large abscess to lt axilla Started as sm pimple, that pt squeezed. Nausea

## 2012-10-03 ENCOUNTER — Emergency Department (HOSPITAL_COMMUNITY)
Admission: EM | Admit: 2012-10-03 | Discharge: 2012-10-03 | Disposition: A | Discharge status: Home / Self Care | Payer: Self-pay | Attending: Emergency Medicine | Admitting: Emergency Medicine

## 2012-10-03 ENCOUNTER — Encounter (HOSPITAL_COMMUNITY): Payer: Self-pay

## 2012-10-03 DIAGNOSIS — Z8669 Personal history of other diseases of the nervous system and sense organs: Secondary | ICD-10-CM | POA: Insufficient documentation

## 2012-10-03 DIAGNOSIS — Z79899 Other long term (current) drug therapy: Secondary | ICD-10-CM | POA: Insufficient documentation

## 2012-10-03 DIAGNOSIS — F17200 Nicotine dependence, unspecified, uncomplicated: Secondary | ICD-10-CM | POA: Insufficient documentation

## 2012-10-03 DIAGNOSIS — IMO0002 Reserved for concepts with insufficient information to code with codable children: Secondary | ICD-10-CM | POA: Insufficient documentation

## 2012-10-03 DIAGNOSIS — I889 Nonspecific lymphadenitis, unspecified: Secondary | ICD-10-CM

## 2012-10-03 DIAGNOSIS — Z8679 Personal history of other diseases of the circulatory system: Secondary | ICD-10-CM | POA: Insufficient documentation

## 2012-10-03 DIAGNOSIS — L0390 Cellulitis, unspecified: Secondary | ICD-10-CM

## 2012-10-03 MED ORDER — SULFAMETHOXAZOLE-TRIMETHOPRIM 800-160 MG PO TABS: 2.0000 | ORAL_TABLET | Freq: Two times a day (BID) | ORAL | Status: DC

## 2012-10-03 MED ORDER — HYDROCODONE-ACETAMINOPHEN 5-325 MG PO TABS: 1.0000 | ORAL_TABLET | ORAL | Status: DC | PRN

## 2012-10-03 NOTE — ED Notes (Signed)
Pt reports was seen here Friday for abscess under left arm.  Reports has been taking antibiotic and is here for recheck.   Pt says area is still painful but says the abscess looks smaller.

## 2012-10-03 NOTE — ED Notes (Signed)
Pt alert & oriented x4, stable gait. Patient given discharge instructions, paperwork & prescription(s). Patient  instructed to stop at the registration desk to finish any additional paperwork. Patient verbalized understanding. Pt left department w/ no further questions. 

## 2012-10-03 NOTE — ED Provider Notes (Signed)
CSN: [id number]     Arrival date & time 10/03/12  4098 History  This chart was scribed for Roney Marion, MD, by Yevette Edwards, ED Scribe. This patient was seen in room APA10/APA10 and the patient's care was started at 10:10 AM.  First MD Initiated Contact with Patient 10/03/12 1004     Chief Complaint  Patient presents with  . Abscess    The history is provided by the patient. No language interpreter was used.   HPI Comments: Colton Sanders is a 37 y.o. male who presents to the Emergency Department complaining of a gradual-onset abscess to his left axilla which began approximately six days ago with associated pain. He reports the abscess began as a small bump and when he squeezed it, a small amount of white discharge occurred. The pt was treated in the ED two days ago for similar symptoms; he was prescribed antibiotics and he reports that the subjective fever, chills, and nausea he had experienced prior to the last ED visit have resolved. He also reports decreased pain associated with the abscess due to the pain medication he was prescribed at the ED visit. He reports that prior to the abscess, he had not experienced any recurrent fever, chills, or unexpected weight loss. He is a daily smoker.   Past Medical History  Diagnosis Date  . Migraine   . Sixth nerve palsy    Past Surgical History  Procedure Laterality Date  . Tooth extraction  05/2011   No family history on file. History  Substance Use Topics  . Smoking status: Current Every Day Smoker    Types: Cigars  . Smokeless tobacco: Not on file  . Alcohol Use: 1.8 oz/week    3 Cans of beer per week     Comment: occ    Review of Systems  Constitutional: Positive for fever (Subjective fever, but denies recurrent fevers in the previous months. ), chills (Since onset of abscess, but denies several months of intermittent chills. ) and appetite change (Associated with the abscess. ). Negative for unexpected weight change.   Gastrointestinal: Positive for nausea. Negative for abdominal pain.  Skin:       Suspected abscess to left axilla.  All other systems reviewed and are negative.    Allergies  Bee venom  Home Medications   Current Outpatient Rx  Name  Route  Sig  Dispense  Refill  . HYDROcodone-acetaminophen (NORCO/VICODIN) 5-325 MG per tablet   Oral   Take 1 tablet by mouth every 6 (six) hours as needed for pain.   20 tablet   0   . HYDROcodone-acetaminophen (NORCO/VICODIN) 5-325 MG per tablet   Oral   Take 1 tablet by mouth every 4 (four) hours as needed for pain.   10 tablet   0   . ibuprofen (ADVIL,MOTRIN) 200 MG tablet   Oral   Take 400 mg by mouth every 6 (six) hours as needed for pain.         Marland Kitchen sulfamethoxazole-trimethoprim (SEPTRA DS) 800-160 MG per tablet   Oral   Take 1 tablet by mouth 2 (two) times daily.   20 tablet   0   . sulfamethoxazole-trimethoprim (SEPTRA DS) 800-160 MG per tablet   Oral   Take 2 tablets by mouth 2 (two) times daily.   28 tablet   0    Triage Vitals: BP 122/82  Pulse 88  Temp(Src) 98 F (36.7 C) (Oral)  Resp 20  Ht 6\' 3"  (1.905 m)  Wt 160 lb (72.576 kg)  BMI 20 kg/m2  SpO2 100%  Physical Exam  Nursing note and vitals reviewed. Constitutional: He is oriented to person, place, and time. He appears well-developed and well-nourished. No distress.  HENT:  Head: Normocephalic and atraumatic.  Eyes: EOM are normal.  Neck: Neck supple. No tracheal deviation present.  Cardiovascular: Normal rate.   Pulmonary/Chest: Effort normal. No respiratory distress.  Abdominal:  No hepatosplenomegaly. Abdomen soft, non-tender.   Musculoskeletal: Normal range of motion.  Lymphadenopathy:  No cervical supraclavicular or right axillary nodes.    Neurological: He is alert and oriented to person, place, and time.  Skin: Skin is warm and dry.  Erythema 1.5 cm in diameter. Not fluctuant. There is an adjacent 3 by 4 cm lymph node without  fluctuance.  Normal skin through upper and lower extremity. Normal perfusion. Normal sensation.   Psychiatric: He has a normal mood and affect. His behavior is normal.    ED Course  Procedures (including critical care time)  DIAGNOSTIC STUDIES: Oxygen Saturation is 100% on room air, normal by my interpretation.    COORDINATION OF CARE:  10:14 AM- Discussed treatment plan with patient, and the patient agreed to the plan.   Labs Review Labs Reviewed - No data to display Imaging Review No results found.  MDM   1. Cellulitis   2. Lymphadenitis    I discussed with the patient that he should be followed up by a surgeon at this swelling in his armpits is not improving. He is sign of infection. He has improved over 2 days after aggressive IV treatment with antibiotics. However he still has a large axillary lymph node. He has not had any E.-type symptoms with weight loss or night sweats. I do not find any other areas of adenopathy or hepatosplenomegaly. His given the name and information for Dr. Lovell Sheehan our general surgeon. I have asked him to call for evaluation in 1 week to 2 weeks if the lymph node is not markedly improved or resolved.  This was given to him in written form upon his discharge as well.   I personally performed the services described in this documentation, which was scribed in my presence. The recorded information has been reviewed and is accurate.    Roney Marion, MD 10/03/12 1026

## 2014-01-16 ENCOUNTER — Encounter (HOSPITAL_COMMUNITY): Payer: Self-pay | Admitting: Emergency Medicine

## 2014-01-16 ENCOUNTER — Emergency Department (HOSPITAL_COMMUNITY)
Admission: EM | Admit: 2014-01-16 | Discharge: 2014-01-16 | Disposition: A | Discharge status: Home / Self Care | Payer: Self-pay | Attending: Emergency Medicine | Admitting: Emergency Medicine

## 2014-01-16 DIAGNOSIS — Y9289 Other specified places as the place of occurrence of the external cause: Secondary | ICD-10-CM | POA: Insufficient documentation

## 2014-01-16 DIAGNOSIS — Z720 Tobacco use: Secondary | ICD-10-CM | POA: Insufficient documentation

## 2014-01-16 DIAGNOSIS — Z791 Long term (current) use of non-steroidal anti-inflammatories (NSAID): Secondary | ICD-10-CM | POA: Insufficient documentation

## 2014-01-16 DIAGNOSIS — T1501XA Foreign body in cornea, right eye, initial encounter: Secondary | ICD-10-CM | POA: Insufficient documentation

## 2014-01-16 DIAGNOSIS — Y998 Other external cause status: Secondary | ICD-10-CM | POA: Insufficient documentation

## 2014-01-16 DIAGNOSIS — Z8679 Personal history of other diseases of the circulatory system: Secondary | ICD-10-CM | POA: Insufficient documentation

## 2014-01-16 DIAGNOSIS — X58XXXA Exposure to other specified factors, initial encounter: Secondary | ICD-10-CM | POA: Insufficient documentation

## 2014-01-16 DIAGNOSIS — S0502XA Injury of conjunctiva and corneal abrasion without foreign body, left eye, initial encounter: Secondary | ICD-10-CM | POA: Insufficient documentation

## 2014-01-16 DIAGNOSIS — Y9389 Activity, other specified: Secondary | ICD-10-CM | POA: Insufficient documentation

## 2014-01-16 MED ORDER — CIPROFLOXACIN HCL 0.3 % OP SOLN
1.0000 [drp] | OPHTHALMIC | Status: DC
  Administered 2014-01-16: 1 [drp] via OPHTHALMIC
  Filled 2014-01-16: qty 2.5

## 2014-01-16 MED ORDER — HYDROCODONE-ACETAMINOPHEN 5-325 MG PO TABS: 2.0000 | ORAL_TABLET | ORAL | Status: DC | PRN

## 2014-01-16 MED ORDER — CIPROFLOXACIN HCL 0.3 % OP SOLN
OPHTHALMIC | Status: AC
  Filled 2014-01-16: qty 2.5

## 2014-01-16 MED ORDER — FLUORESCEIN SODIUM 1 MG OP STRP
1.0000 | ORAL_STRIP | Freq: Once | OPHTHALMIC | Status: DC
  Filled 2014-01-16: qty 1

## 2014-01-16 MED ORDER — NAPROXEN 500 MG PO TABS: 500.0000 mg | ORAL_TABLET | Freq: Two times a day (BID) | ORAL | Status: DC

## 2014-01-16 MED ORDER — TETRACAINE HCL 0.5 % OP SOLN
1.0000 [drp] | Freq: Once | OPHTHALMIC | Status: DC
  Filled 2014-01-16: qty 2
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
## 2014-01-16 NOTE — Discharge Instructions (Signed)
Please call your doctor for a followup appointment within 24-48 hours. When you talk to your doctor please let them know that you were seen in the emergency department and have them acquire all of your records so that they can discuss the findings with you and formulate a treatment plan to fully care for your new and ongoing problems. ° °
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
## 2014-03-31 ENCOUNTER — Emergency Department (HOSPITAL_COMMUNITY): Payer: Self-pay

## 2014-03-31 ENCOUNTER — Emergency Department (HOSPITAL_COMMUNITY)
Admission: EM | Admit: 2014-03-31 | Discharge: 2014-03-31 | Disposition: A | Discharge status: Home / Self Care | Payer: Self-pay | Attending: Emergency Medicine | Admitting: Emergency Medicine

## 2014-03-31 ENCOUNTER — Encounter (HOSPITAL_COMMUNITY): Payer: Self-pay | Admitting: Emergency Medicine

## 2014-03-31 DIAGNOSIS — M542 Cervicalgia: Secondary | ICD-10-CM | POA: Insufficient documentation

## 2014-03-31 DIAGNOSIS — Z720 Tobacco use: Secondary | ICD-10-CM | POA: Insufficient documentation

## 2014-03-31 DIAGNOSIS — Z791 Long term (current) use of non-steroidal anti-inflammatories (NSAID): Secondary | ICD-10-CM | POA: Insufficient documentation

## 2014-03-31 DIAGNOSIS — Z8669 Personal history of other diseases of the nervous system and sense organs: Secondary | ICD-10-CM | POA: Insufficient documentation

## 2014-03-31 DIAGNOSIS — Z8679 Personal history of other diseases of the circulatory system: Secondary | ICD-10-CM | POA: Insufficient documentation

## 2014-03-31 DIAGNOSIS — Z8781 Personal history of (healed) traumatic fracture: Secondary | ICD-10-CM | POA: Insufficient documentation

## 2014-03-31 MED ORDER — HYDROMORPHONE HCL 1 MG/ML IJ SOLN
1.0000 mg | Freq: Once | INTRAMUSCULAR | Status: AC
  Administered 2014-03-31: 1 mg via INTRAVENOUS
  Filled 2014-03-31: qty 1

## 2014-03-31 MED ORDER — ONDANSETRON HCL 4 MG/2ML IJ SOLN
4.0000 mg | Freq: Once | INTRAMUSCULAR | Status: AC
  Administered 2014-03-31: 4 mg via INTRAMUSCULAR
  Filled 2014-03-31: qty 2

## 2014-03-31 NOTE — ED Notes (Signed)
Jessica(Case Manager) in the room to see the Pt about 20 minutes ago.

## 2014-03-31 NOTE — ED Notes (Signed)
Pt continues to complain loudly from room about nothing specific.  Asked to lower his voice.  Family at bedside.
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
## 2014-03-31 NOTE — Discharge Instructions (Signed)
You were seen today for worsening neck pain and difficulty at home. Your CT scan is unchanged.  The case manager will call you regarding obtaining a hospital bed at home. You should maintain your follow-up appointment with Dr. Romeo AppleHarrison as scheduled. Continue to take your scheduled pain medication as prescribed.
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
## 2014-04-01 ENCOUNTER — Emergency Department (HOSPITAL_COMMUNITY)
Admission: EM | Admit: 2014-04-01 | Discharge: 2014-04-01 | Discharge status: Against Medical Advice | Payer: Self-pay | Attending: Emergency Medicine | Admitting: Emergency Medicine

## 2014-04-01 ENCOUNTER — Encounter (HOSPITAL_COMMUNITY): Payer: Self-pay | Admitting: *Deleted

## 2014-04-01 DIAGNOSIS — Z7901 Long term (current) use of anticoagulants: Secondary | ICD-10-CM

## 2014-04-01 DIAGNOSIS — R040 Epistaxis: Secondary | ICD-10-CM | POA: Insufficient documentation

## 2014-04-01 DIAGNOSIS — D689 Coagulation defect, unspecified: Secondary | ICD-10-CM | POA: Insufficient documentation

## 2014-04-01 DIAGNOSIS — Z8669 Personal history of other diseases of the nervous system and sense organs: Secondary | ICD-10-CM | POA: Insufficient documentation

## 2014-04-01 DIAGNOSIS — Z720 Tobacco use: Secondary | ICD-10-CM | POA: Insufficient documentation

## 2014-04-01 DIAGNOSIS — Z8679 Personal history of other diseases of the circulatory system: Secondary | ICD-10-CM | POA: Insufficient documentation

## 2014-04-01 MED ORDER — ONDANSETRON 4 MG PO TBDP
4.0000 mg | ORAL_TABLET | Freq: Once | ORAL | Status: AC
  Administered 2014-04-01: 4 mg via ORAL

## 2014-04-01 MED ORDER — OXYMETAZOLINE HCL 0.05 % NA SOLN
1.0000 | Freq: Once | NASAL | Status: AC
  Administered 2014-04-01: 1 via NASAL

## 2014-04-01 MED ORDER — ONDANSETRON 4 MG PO TBDP
ORAL_TABLET | ORAL | Status: AC
  Filled 2014-04-01: qty 1

## 2014-04-01 MED ORDER — OXYMETAZOLINE HCL 0.05 % NA SOLN
NASAL | Status: AC
  Filled 2014-04-01: qty 15
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
## 2014-04-01 NOTE — ED Notes (Signed)
Pt arrived by EMS w/ complaint of nose bleed. Pt is on xarelto. Pt has surgeries a little over a week ago.

## 2014-04-01 NOTE — ED Notes (Signed)
MD at bedside. 

## 2014-04-01 NOTE — ED Notes (Signed)
Pt currently in for nose bleed.  Pt holding pressure to right nare.  C/o blood trickling in back of throat.

## 2017-10-01 ENCOUNTER — Emergency Department (HOSPITAL_COMMUNITY)
Admission: EM | Admit: 2017-10-01 | Discharge: 2017-10-01 | Disposition: A | Discharge status: Home / Self Care | Payer: Self-pay | Attending: Emergency Medicine | Admitting: Emergency Medicine

## 2017-10-01 ENCOUNTER — Emergency Department (HOSPITAL_COMMUNITY): Payer: Self-pay

## 2017-10-01 ENCOUNTER — Encounter (HOSPITAL_COMMUNITY): Payer: Self-pay

## 2017-10-01 ENCOUNTER — Other Ambulatory Visit: Payer: Self-pay

## 2017-10-01 DIAGNOSIS — R072 Precordial pain: Secondary | ICD-10-CM | POA: Insufficient documentation

## 2017-10-01 DIAGNOSIS — F17210 Nicotine dependence, cigarettes, uncomplicated: Secondary | ICD-10-CM | POA: Insufficient documentation

## 2017-10-01 DIAGNOSIS — R0602 Shortness of breath: Secondary | ICD-10-CM | POA: Insufficient documentation

## 2017-10-01 HISTORY — DX: Other pulmonary embolism without acute cor pulmonale: I26.99

## 2017-10-01 LAB — BASIC METABOLIC PANEL
ANION GAP: 10 (ref 5–15)
BUN: 11 mg/dL (ref 6–20)
CO2: 26 mmol/L (ref 22–32)
Calcium: 9.1 mg/dL (ref 8.9–10.3)
Chloride: 102 mmol/L (ref 98–111)
Creatinine, Ser: 0.79 mg/dL (ref 0.61–1.24)
GFR calc Af Amer: >60 mL/min (ref >60)
GLUCOSE: 106 mg/dL — AB (ref 70–99)
Potassium: 3.4 mmol/L — ABNORMAL LOW (ref 3.5–5.1)
Sodium: 138 mmol/L (ref 135–145)

## 2017-10-01 LAB — CBC WITH DIFFERENTIAL/PLATELET
Basophils Absolute: 0.1 10*3/uL (ref 0.0–0.1)
Basophils Relative: 1 %
Eosinophils Absolute: 0.2 10*3/uL (ref 0.0–0.7)
Eosinophils Relative: 3 %
HEMATOCRIT: 44.8 % (ref 39.0–52.0)
HEMOGLOBIN: 15.3 g/dL (ref 13.0–17.0)
LYMPHS ABS: 2.1 10*3/uL (ref 0.7–4.0)
Lymphocytes Relative: 33 %
MCH: 33.8 pg (ref 26.0–34.0)
MCHC: 34.2 g/dL (ref 30.0–36.0)
MCV: 99.1 fL (ref 78.0–100.0)
MONOS PCT: 6 %
Monocytes Absolute: 0.4 10*3/uL (ref 0.1–1.0)
NEUTROS ABS: 3.6 10*3/uL (ref 1.7–7.7)
Neutrophils Relative %: 56 %
PLATELETS: 317 10*3/uL (ref 150–400)
RBC: 4.52 MIL/uL (ref 4.22–5.81)
RDW: 13.9 % (ref 11.5–15.5)
WBC: 6.4 10*3/uL (ref 4.0–10.5)

## 2017-10-01 LAB — D-DIMER, QUANTITATIVE: D-Dimer, Quant: 0.91 ug/mL-FEU — ABNORMAL HIGH (ref 0.00–0.50)

## 2017-10-01 LAB — TROPONIN I: Troponin I: <0.03 ng/mL (ref <0.03)

## 2017-10-01 MED ORDER — NAPROXEN 500 MG PO TABS: 500.0000 mg | ORAL_TABLET | Freq: Two times a day (BID) | ORAL | 0 refills | Status: DC

## 2017-10-01 MED ORDER — IOPAMIDOL (ISOVUE-370) INJECTION 76%
100.0000 mL | Freq: Once | INTRAVENOUS | Status: AC | PRN
  Administered 2017-10-01: 100 mL via INTRAVENOUS

## 2017-10-01 MED ORDER — ASPIRIN 81 MG PO CHEW
324.0000 mg | CHEWABLE_TABLET | Freq: Once | ORAL | Status: AC
  Administered 2017-10-01: 324 mg via ORAL
  Filled 2017-10-01: qty 4

## 2017-10-01 NOTE — ED Triage Notes (Signed)
Pt c/o chest pain x 1 week.  Reports pain worse with movement.  Pt reports history of PE. Reports sob when pain gets severe.  Pt also c/o cold symptoms.  Reports cough productive at times with greenish colored sputum.

## 2017-10-01 NOTE — Discharge Instructions (Addendum)
Today's work-up without evidence of any acute cardiac or heart event no evidence of any pneumonia no evidence of any blood clots in the lungs.  Take the Naprosyn as directed.  Work note provided.  Return for any new or worse symptoms.

## 2017-10-01 NOTE — ED Provider Notes (Signed)
[facility] EMERGENCY DEPARTMENT Provider Note   CSN: [id number] Arrival date & time: 10/01/17  0731     History   Chief Complaint Chief Complaint  Patient presents with  . Chest Pain    HPI Colton Sanders is a 42 y.o. male.  Patient with anterior upper chest discomfort bilaterally for about a week.  Patient states she had a history of pulmonary embolism in 2016.  He was concerned it may be that has happened again.  Denied any lower extremity swelling.  Has had some pain in the left knee area.  There is associated with some shortness of breath no nausea or vomiting no diaphoresis.  Pain is nonradiating.  Does not go to the neck jaw back or arms.  Pain is not made worse or better by anything.     Past Medical History:  Diagnosis Date  . Migraine   . Pulmonary emboli (HCC)   . Sixth nerve palsy     Patient Active Problem List   Diagnosis Date Noted  . 6th nerve palsy 06/25/2011  . Headache(784.0) 06/25/2011  . 5th nerve palsy 06/25/2011    Past Surgical History:  Procedure Laterality Date  . FRACTURE SURGERY    . TOOTH EXTRACTION  05/2011        Home Medications    Prior to Admission medications   Medication Sig Start Date End Date Taking? Authorizing Provider  ibuprofen (ADVIL,MOTRIN) 200 MG tablet Take 400 mg by mouth every 6 (six) hours as needed for pain.   Yes [provider]  HYDROcodone-acetaminophen (NORCO/VICODIN) 5-325 MG per tablet Take 2 tablets by mouth every 4 (four) hours as needed. Patient not taking: Reported on 03/31/2014 01/16/14   Eber Hong, MD  naproxen (NAPROSYN) 500 MG tablet Take 1 tablet (500 mg total) by mouth 2 (two) times daily with a meal. Patient not taking: Reported on 03/31/2014 01/16/14   Eber Hong, MD  naproxen (NAPROSYN) 500 MG tablet Take 1 tablet (500 mg total) by mouth 2 (two) times daily. 10/01/17   Vanetta Mulders, MD    Family History Family History  Problem Relation Age of Onset  . Diabetes Mother   .  Diabetes Father   . Hypertension Father   . Stroke Other     Social History Social History   Tobacco Use  . Smoking status: Current Every Day Smoker    Types: Cigars  . Smokeless tobacco: Never Used  Substance Use Topics  . Alcohol use: Yes    Alcohol/week: 3.0 standard drinks    Types: 3 Cans of beer per week    Comment: occ  . Drug use: No     Allergies   Bee venom   Review of Systems Review of Systems  Constitutional: Negative for diaphoresis and fever.  HENT: Negative for congestion.   Eyes: Negative for visual disturbance.  Respiratory: Positive for shortness of breath.   Cardiovascular: Positive for chest pain. Negative for leg swelling.  Gastrointestinal: Negative for abdominal pain, nausea and vomiting.  Genitourinary: Negative for dysuria.  Musculoskeletal: Negative for back pain and neck pain.  Skin: Negative for rash.  Neurological: Negative for syncope.  Hematological: Does not bruise/bleed easily.  Psychiatric/Behavioral: Negative for confusion.     Physical Exam Updated Vital Signs BP 129/86   Pulse (!) 142   Temp 97.9 F (36.6 C) (Oral)   Resp 12   Ht 1.905 m (6\' 3" )   Wt 70.3 kg   SpO2 (!) 77%   BMI  19.37 kg/m   Physical Exam  Constitutional: He is oriented to person, place, and time. He appears well-developed and well-nourished. No distress.  HENT:  Head: Normocephalic and atraumatic.  Mouth/Throat: Oropharynx is clear and moist.  Eyes: Pupils are equal, round, and reactive to light. Conjunctivae and EOM are normal.  Neck: Normal range of motion. Neck supple.  Cardiovascular: Normal rate, regular rhythm and normal heart sounds.  Pulmonary/Chest: Effort normal and breath sounds normal. No respiratory distress. He exhibits no tenderness.  Abdominal: Soft. Bowel sounds are normal. There is no tenderness.  Musculoskeletal: Normal range of motion. He exhibits no edema.  Neurological: He is alert and oriented to person, place, and time. No  cranial nerve deficit or sensory deficit. He exhibits normal muscle tone. Coordination normal.  Skin: Skin is warm.  Nursing note and vitals reviewed.    ED Treatments / Results  Labs (all labs ordered are listed, but only abnormal results are displayed) Labs Reviewed  BASIC METABOLIC PANEL - Abnormal; Notable for the following components:      Result Value   Potassium 3.4 (*)    Glucose, Bld 106 (*)    All other components within normal limits  D-DIMER, QUANTITATIVE (NOT AT [facility]) - Abnormal; Notable for the following components:   D-Dimer, Quant 0.91 (*)    All other components within normal limits  CBC WITH DIFFERENTIAL/PLATELET  TROPONIN I    EKG EKG Interpretation  Date/Time:  Thursday October 01 2017 07:45:11 EDT Ventricular Rate:  71 PR Interval:    QRS Duration: 99 QT Interval:  416 QTC Calculation: 453 R Axis:   80 Text Interpretation:  Sinus rhythm Left atrial enlargement Nonspecific T abnrm, anterolateral leads No significant change since last tracing Confirmed by Vanetta MuldersZackowski, Kynedi Profitt [phone]([id number]) on 10/01/2017 7:54:29 AM   Radiology Dg Chest 2 View  Result Date: 10/01/2017 CLINICAL DATA:  Chest pain. EXAM: CHEST - 2 VIEW COMPARISON:  10/23/2011. FINDINGS: Mediastinum and hilar structures normal. Heart size normal. Mild atelectasis/infiltrate right middle lobe. No pleural effusion or pneumothorax. No acute bony abnormality. IMPRESSION: Mild subsegment atelectasis/infiltrate right middle lobe. Electronically Signed   By: Maisie Fushomas  Register   On: 10/01/2017 09:04   Ct Angio Chest Pe W/cm &/or Wo Cm  Result Date: 10/01/2017 CLINICAL DATA:  Positive D-dimer, mid chest pain with shortness of breath EXAM: CT ANGIOGRAPHY CHEST WITH CONTRAST TECHNIQUE: Multidetector CT imaging of the chest was performed using the standard protocol during bolus administration of intravenous contrast. Multiplanar CT image reconstructions and MIPs were obtained to evaluate the vascular anatomy.  CONTRAST:  100mL ISOVUE-370 IOPAMIDOL (ISOVUE-370) INJECTION 76% COMPARISON:  Chest x-ray of 10/01/2017 FINDINGS: Cardiovascular: The pulmonary arteries are well opacified. There is no evidence of acute pulmonary embolism. The thoracic aorta also is well opacified with no acute abnormality. The mid ascending thoracic aorta measures 29 mm in diameter. The heart is within upper limits of normal. No pericardial effusion is seen. No significant coronary artery calcification is noted. Mediastinum/Nodes: No mediastinal or hilar adenopathy is seen. No esophageal abnormality is evident by CT. Lungs/Pleura: On lung window images, no pneumonia or effusion is seen. No suspicious lung nodule or mass is evident. Linear scarring or atelectasis is noted in the left lower lobe and within the lingula. No pleural effusion is seen. Upper Abdomen: Within the upper abdomen, no abnormality is evident. Musculoskeletal: The thoracic vertebrae are normal alignment. There is degenerative change at the sternomanubrial junction. Review of the MIP images confirms the above findings. IMPRESSION: 1.  No evidence of acute pulmonary embolism. The pulmonary arteries are well opacified. No abnormality of the thoracic aorta is noted. 2. No lung infiltrate or pleural effusion. Linear atelectasis or scarring in both lower lobes. No pleural effusion. 3. Degenerative change at the sternomanubrial junction. Electronically Signed   By: Dwyane Dee M.D.   On: 10/01/2017 12:38    Procedures Procedures (including critical care time)  Medications Ordered in ED Medications  aspirin chewable tablet 324 mg (324 mg Oral Given 10/01/17 0855)  iopamidol (ISOVUE-370) 76 % injection 100 mL (100 mLs Intravenous Contrast Given 10/01/17 1229)     Initial Impression / Assessment and Plan / ED Course  I have reviewed the triage vital signs and the nursing notes.  Pertinent labs & imaging results that were available during my care of the patient were reviewed by  me and considered in my medical decision making (see chart for details).    Work-up for the chest pain without any acute cardiac findings.  EKG without significant abnormalities.  Troponin negative.  Symptoms been present for a week.  Chest x-ray raise some concerns about pneumonia.  And patient's d-dimer was elevated some.  So CT angios chest was done.  That shows no evidence of pulmonary embolism shows no evidence of pneumonia.  Patient's electrolytes without significant abnormality labs without significant abnormality.  Patient will be treated for chest wall pain with Naprosyn for a week work note provided.  Patient knows to return for any new or worse symptoms.  Feel the pain may very well be chest wall in nature.  No concerns for pulmonary embolism no concerns for acute cardiac event.   Final Clinical Impressions(s) / ED Diagnoses   Final diagnoses:  Precordial pain    ED Discharge Orders         Ordered    naproxen (NAPROSYN) 500 MG tablet  2 times daily     10/01/17 1309           Vanetta Mulders, MD 10/01/17 1318

## 2018-01-20 ENCOUNTER — Encounter (HOSPITAL_COMMUNITY): Payer: Self-pay | Admitting: Emergency Medicine

## 2018-01-20 ENCOUNTER — Other Ambulatory Visit: Payer: Self-pay

## 2018-01-20 ENCOUNTER — Emergency Department (HOSPITAL_COMMUNITY): Payer: BLUE CROSS/BLUE SHIELD

## 2018-01-20 DIAGNOSIS — Z5321 Procedure and treatment not carried out due to patient leaving prior to being seen by health care provider: Secondary | ICD-10-CM | POA: Insufficient documentation

## 2018-01-20 DIAGNOSIS — F17210 Nicotine dependence, cigarettes, uncomplicated: Secondary | ICD-10-CM | POA: Insufficient documentation

## 2018-01-20 DIAGNOSIS — M25572 Pain in left ankle and joints of left foot: Secondary | ICD-10-CM | POA: Diagnosis not present

## 2018-01-20 DIAGNOSIS — Z79899 Other long term (current) drug therapy: Secondary | ICD-10-CM | POA: Insufficient documentation

## 2018-01-20 NOTE — ED Triage Notes (Signed)
Pt c/o left ankle pain, pt denies any injury to ankle.

## 2018-01-21 ENCOUNTER — Emergency Department (HOSPITAL_COMMUNITY)
Admission: EM | Admit: 2018-01-21 | Discharge: 2018-01-21 | Disposition: A | Discharge status: Home / Self Care | Payer: BLUE CROSS/BLUE SHIELD | Attending: Emergency Medicine | Admitting: Emergency Medicine

## 2018-01-21 ENCOUNTER — Encounter (HOSPITAL_COMMUNITY): Payer: Self-pay | Admitting: Emergency Medicine

## 2018-01-21 ENCOUNTER — Other Ambulatory Visit: Payer: Self-pay

## 2018-01-21 ENCOUNTER — Emergency Department (HOSPITAL_COMMUNITY)
Admission: EM | Admit: 2018-01-21 | Discharge: 2018-01-21 | Disposition: A | Discharge status: Home / Self Care | Payer: BLUE CROSS/BLUE SHIELD | Source: Home / Self Care | Attending: Emergency Medicine | Admitting: Emergency Medicine

## 2018-01-21 DIAGNOSIS — Z79899 Other long term (current) drug therapy: Secondary | ICD-10-CM

## 2018-01-21 DIAGNOSIS — F17210 Nicotine dependence, cigarettes, uncomplicated: Secondary | ICD-10-CM | POA: Insufficient documentation

## 2018-01-21 DIAGNOSIS — M25572 Pain in left ankle and joints of left foot: Secondary | ICD-10-CM | POA: Insufficient documentation

## 2018-01-21 MED ORDER — DICLOFENAC SODIUM 75 MG PO TBEC: 75.0000 mg | DELAYED_RELEASE_TABLET | Freq: Two times a day (BID) | ORAL | 0 refills | Status: DC

## 2018-01-21 MED ORDER — PREDNISONE 20 MG PO TABS
40.0000 mg | ORAL_TABLET | Freq: Once | ORAL | Status: AC
  Administered 2018-01-21: 40 mg via ORAL
  Filled 2018-01-21: qty 2

## 2018-01-21 MED ORDER — ACETAMINOPHEN 500 MG PO TABS
1000.0000 mg | ORAL_TABLET | Freq: Once | ORAL | Status: AC
  Administered 2018-01-21: 1000 mg via ORAL
  Filled 2018-01-21: qty 2

## 2018-01-21 MED ORDER — KETOROLAC TROMETHAMINE 10 MG PO TABS
10.0000 mg | ORAL_TABLET | Freq: Once | ORAL | Status: AC
  Administered 2018-01-21: 10 mg via ORAL
  Filled 2018-01-21: qty 1

## 2018-01-21 MED ORDER — DEXAMETHASONE 4 MG PO TABS: 4.0000 mg | ORAL_TABLET | Freq: Two times a day (BID) | ORAL | 0 refills | Status: DC

## 2018-01-21 NOTE — ED Triage Notes (Signed)
Pt c/o of left ankle pain denies injury

## 2018-01-21 NOTE — ED Notes (Signed)
Patient seen here last night for ankle pain, was unable to stay to obtain xray results. C/O L ankle pain, had remote injury with internal fixation. Patient reports difficulty bearing weight.

## 2018-01-21 NOTE — Discharge Instructions (Addendum)
There is no neurologic or vascular deficits noted of your lower extremity.  The x-ray of your lower extremity is negative for fracture or dislocation.  The surgical hardware appears to be in place and intact.  Your history and your examination suggests possible strain/sprain, or exacerbation of arthritis changes following surgical intervention of your lower extremity.  Please use Decadron and diclofenac 2 times daily with food.  Please use Tylenol extra strength in between doses if needed.  Please see Dr. Hilda Lias, or your orthopedic specialist at [facility] for additional follow-up and management of this issue.  Please use your ankle stirrup splint until you can safely apply weight to the area.

## 2018-01-21 NOTE — ED Provider Notes (Signed)
[facility] EMERGENCY DEPARTMENT Provider Note   CSN: [id number] Arrival date & time: 01/21/18  1202     History   Chief Complaint Chief Complaint  Patient presents with  . Leg Pain    HPI Colton Sanders is a 43 y.o. male.  Pt has a rod inserted in the left lower extremity in 2016. Now has difficulty bearing weight.  Patient states he has been walking around on the leg more, and he has a very physical job.  He states that he may have strained it at some point in time.  He has not noted any hot joints.  No red streaks going up the leg.  No open skin areas.  No known injury reported.  The history is provided by the patient.  Leg Pain  Location:  Ankle Ankle location:  L ankle Associated symptoms: no back pain and no neck pain     Past Medical History:  Diagnosis Date  . Migraine   . Pulmonary emboli (HCC)   . Sixth nerve palsy     Patient Active Problem List   Diagnosis Date Noted  . 6th nerve palsy 06/25/2011  . Headache(784.0) 06/25/2011  . 5th nerve palsy 06/25/2011    Past Surgical History:  Procedure Laterality Date  . FRACTURE SURGERY    . TOOTH EXTRACTION  05/2011        Home Medications    Prior to Admission medications   Medication Sig Start Date End Date Taking? Authorizing Provider  HYDROcodone-acetaminophen (NORCO/VICODIN) 5-325 MG per tablet Take 2 tablets by mouth every 4 (four) hours as needed. Patient not taking: Reported on 03/31/2014 01/16/14   Eber Hong, MD  ibuprofen (ADVIL,MOTRIN) 200 MG tablet Take 400 mg by mouth every 6 (six) hours as needed for pain.    [provider]  naproxen (NAPROSYN) 500 MG tablet Take 1 tablet (500 mg total) by mouth 2 (two) times daily with a meal. Patient not taking: Reported on 03/31/2014 01/16/14   Eber Hong, MD  naproxen (NAPROSYN) 500 MG tablet Take 1 tablet (500 mg total) by mouth 2 (two) times daily. 10/01/17   Vanetta Mulders, MD    Family History Family History  Problem Relation Age  of Onset  . Diabetes Mother   . Diabetes Father   . Hypertension Father   . Stroke Other     Social History Social History   Tobacco Use  . Smoking status: Current Every Day Smoker    Packs/day: 1.00    Types: Cigars, Cigarettes  . Smokeless tobacco: Never Used  Substance Use Topics  . Alcohol use: Yes    Alcohol/week: 3.0 standard drinks    Types: 3 Cans of beer per week    Comment: occ  . Drug use: Yes    Types: Marijuana     Allergies   Bee venom   Review of Systems Review of Systems  Constitutional: Negative for activity change.       All ROS Neg except as noted in HPI  HENT: Negative for nosebleeds.   Eyes: Negative for photophobia and discharge.  Respiratory: Negative for cough, shortness of breath and wheezing.   Cardiovascular: Negative for chest pain and palpitations.  Gastrointestinal: Negative for abdominal pain and blood in stool.  Genitourinary: Negative for dysuria, frequency and hematuria.  Musculoskeletal: Positive for arthralgias. Negative for back pain and neck pain.  Skin: Negative.   Neurological: Negative for dizziness, seizures and speech difficulty.  Psychiatric/Behavioral: Negative for confusion and hallucinations.  Physical Exam Updated Vital Signs BP (!) 150/84   Pulse 93   Temp 97.8 F (36.6 C) (Oral)   Resp 20   Ht 6\' 3"  (1.905 m)   Wt 70.3 kg   SpO2 98%   BMI 19.37 kg/m   Physical Exam   ED Treatments / Results  Labs (all labs ordered are listed, but only abnormal results are displayed) Labs Reviewed - No data to display  EKG None  Radiology Dg Ankle Complete Left  Result Date: 01/20/2018 CLINICAL DATA:  Ankle pain EXAM: LEFT ANKLE COMPLETE - 3+ VIEW COMPARISON:  None. FINDINGS: Intramedullary rod and fixating screws in the distal tibia. No acute displaced fracture or malalignment. Ankle mortise is symmetric. IMPRESSION: Postsurgical changes in the distal tibia. No acute osseous abnormality. Electronically Signed    By: Jasmine Pang M.D.   On: 01/20/2018 22:20    Procedures Procedures (including critical care time)  Medications Ordered in ED Medications - No data to display   Initial Impression / Assessment and Plan / ED Course  I have reviewed the triage vital signs and the nursing notes.  Pertinent labs & imaging results that were available during my care of the patient were reviewed by me and considered in my medical decision making (see chart for details).      Final Clinical Impressions(s) / ED Diagnoses MDM  Vital signs within normal limits.  Pulse oximetry is within normal limits.  Patient had rod and screws in the left lower extremity approximately 2 years ago.  The patient states that he has been on his feet more over the holiday than usual, and that he may have stretched or strained the ankle area.  X-ray is negative for fracture, or dislocation.  The hardware is in place.  There are no neurovascular deficits appreciated.  I discussed these findings with the patient in terms which he understands.  The patient will use an ankle stirrup splint.  Prescription for diclofenac and Decadron given to the patient to use.  Patient referred to orthopedics.   Final diagnoses:  Acute left ankle pain    ED Discharge Orders         Ordered    dexamethasone (DECADRON) 4 MG tablet  2 times daily with meals     01/21/18 1317    diclofenac (VOLTAREN) 75 MG EC tablet  2 times daily     01/21/18 1317           Ivery Quale, PA-C 01/21/18 1324    Bethann Berkshire, MD 01/21/18 1547

## 2020-03-04 IMAGING — CT CT ANGIO CHEST
2 of 6 series · 19 of 36 positions shown · IV contrast (Isovue)
Comparison: Chest x-ray of 10/01/2017

CLINICAL DATA: Positive D-dimer, mid chest pain with shortness of
breath

EXAM:
CT ANGIOGRAPHY CHEST WITH CONTRAST
TECHNIQUE: Multidetector CT imaging of the chest was performed using the
standard protocol during bolus administration of intravenous
contrast. Multiplanar CT image reconstructions and MIPs were
obtained to evaluate the vascular anatomy.
CONTRAST:  100mL [id number] IOPAMIDOL ([id number]) INJECTION 76%

[Series 6: thins · axial · 0.66mm/px · z∈[+59,+365]mm · 18 of 340 slices shown]
[im 17/340  lung]
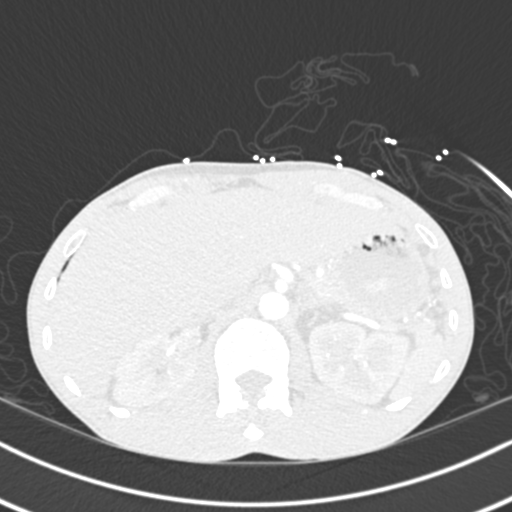
[im 34/340  mediastinal]
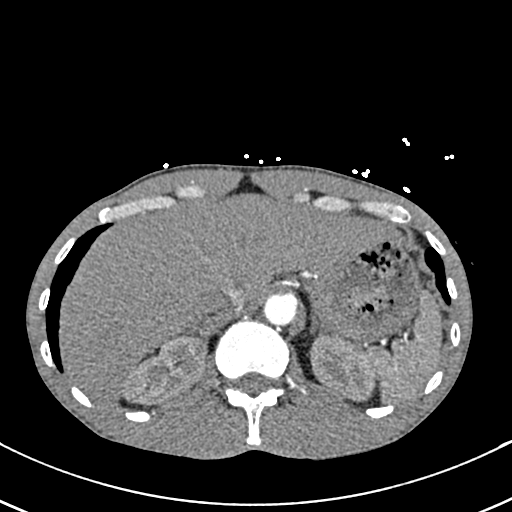
[im 51/340  lung]
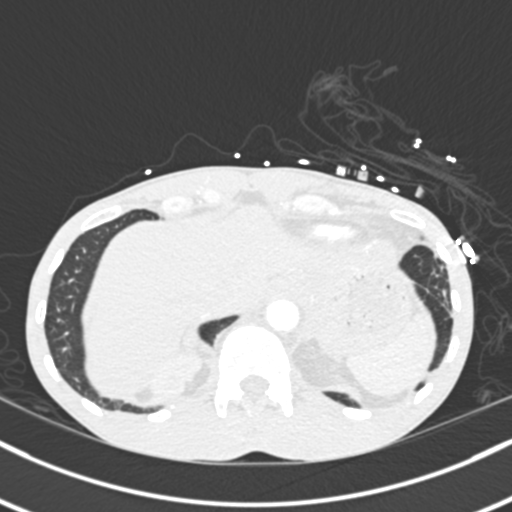
[im 68/340  mediastinal]
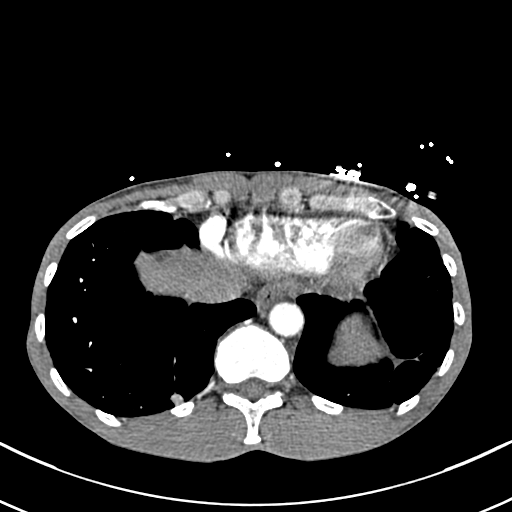
[im 85/340  lung]
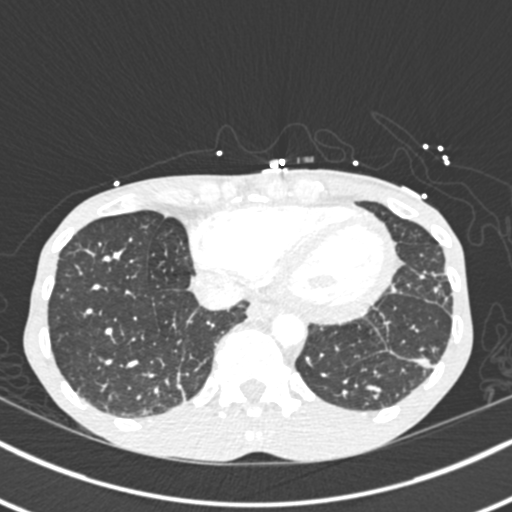
[im 102/340  mediastinal]
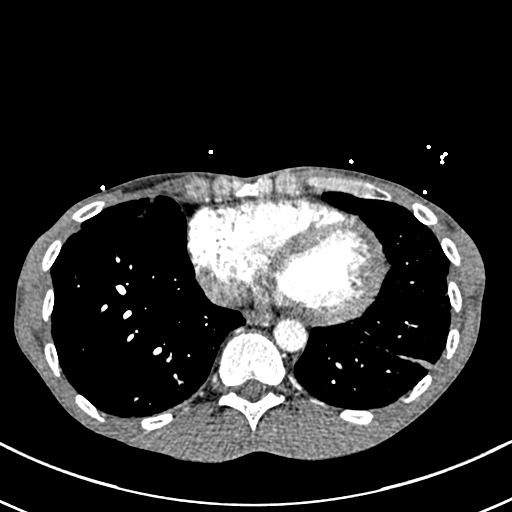
[im 119/340  lung]
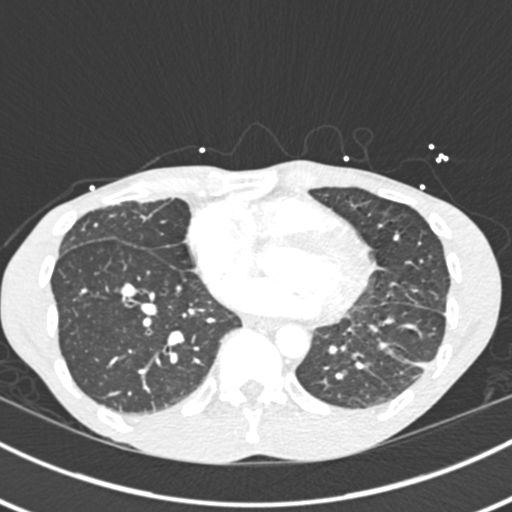
[im 136/340  mediastinal]
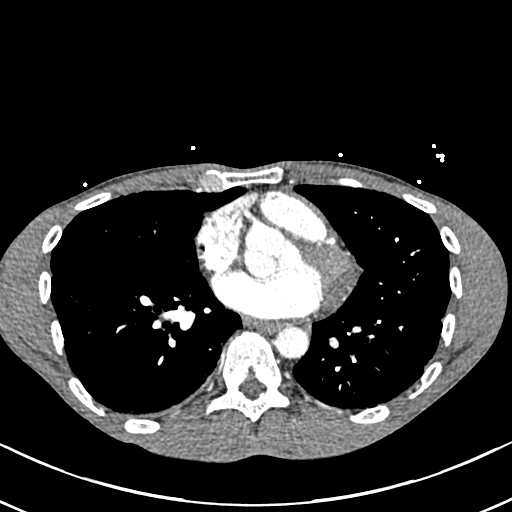
[im 153/340  lung]
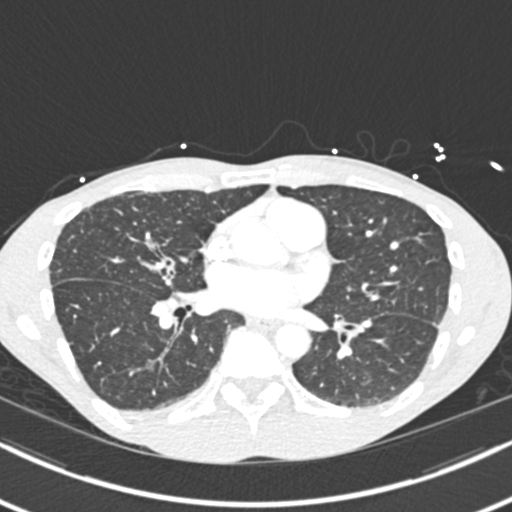
[im 187/340  mediastinal]
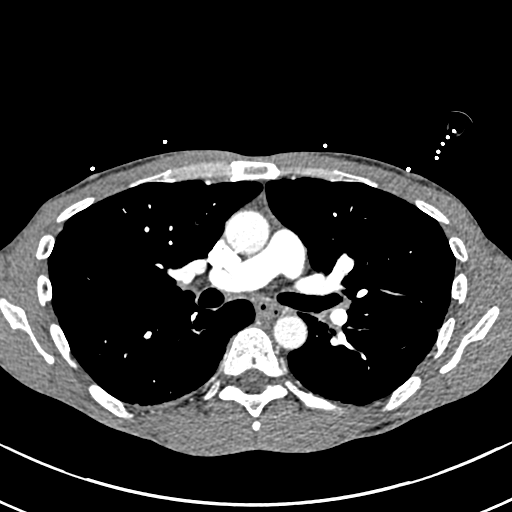
[im 204/340  lung]
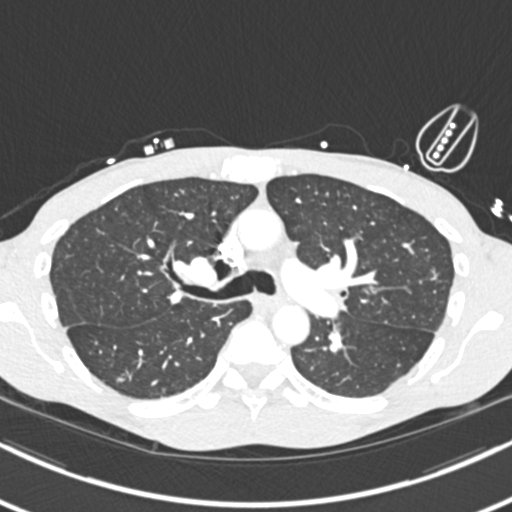
[im 221/340  mediastinal]
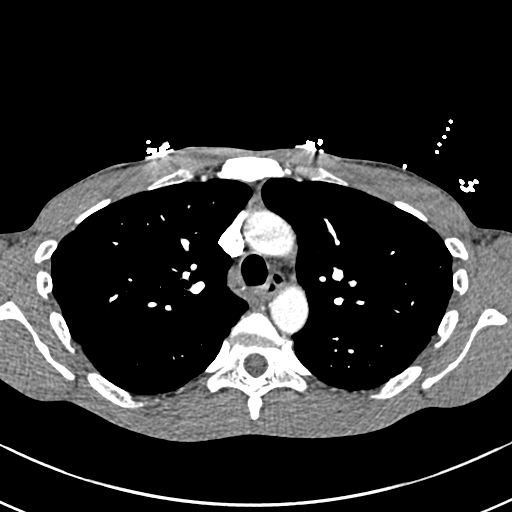
[im 238/340  lung]
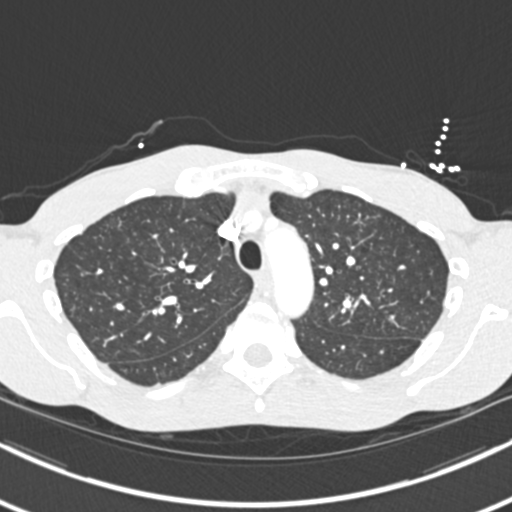
[im 255/340  mediastinal]
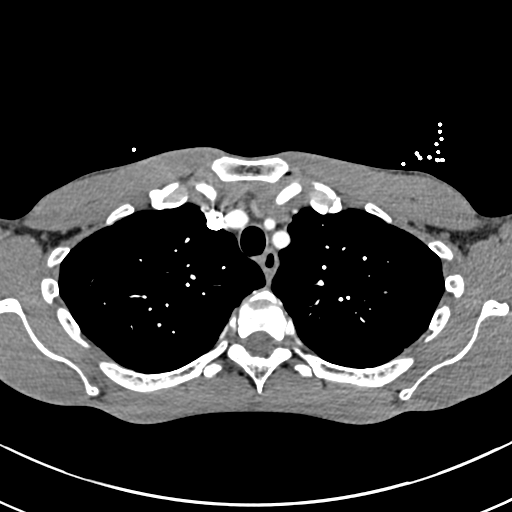
[im 272/340  lung]
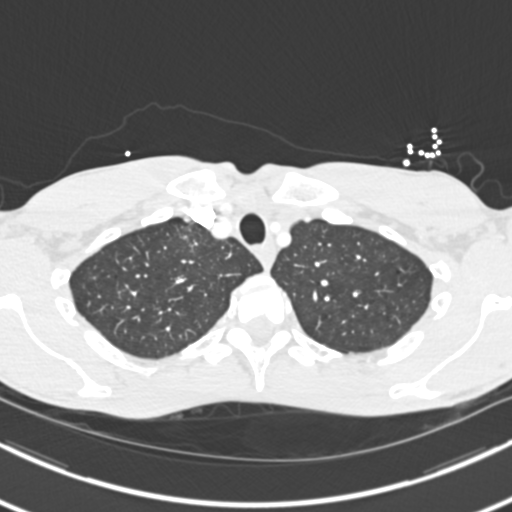
[im 289/340  mediastinal]
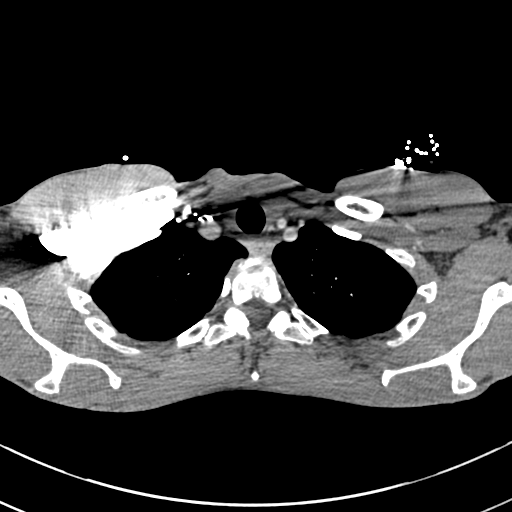
[im 306/340  lung]
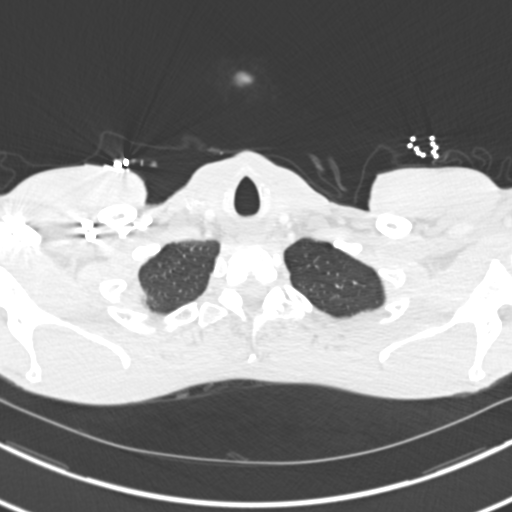
[im 323/340  mediastinal]
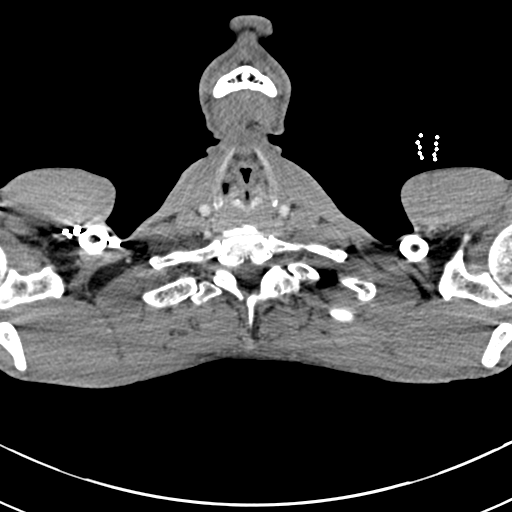

[Series 8: coronal mpr · coronal · 0.67mm/px · 1 of 111 slices shown]
[im 56/111  mediastinal]
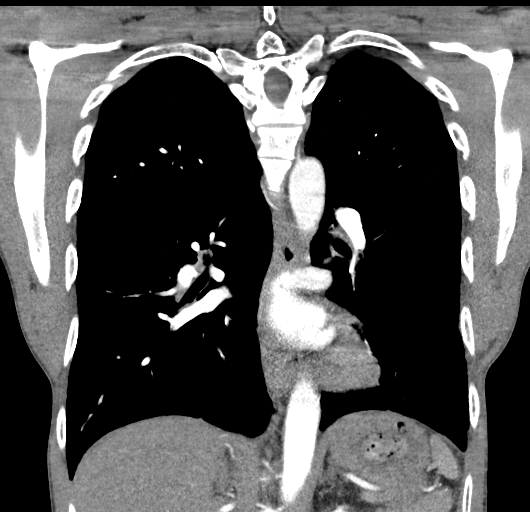

[19 of 36 positions shown; findings below may reference images not displayed]

FINDINGS: Cardiovascular: The pulmonary arteries are well opacified. There is
no evidence of acute pulmonary embolism. The thoracic aorta also is
well opacified with no acute abnormality. The mid ascending thoracic
aorta measures 29 mm in diameter. The heart is within upper limits
of normal. No pericardial effusion is seen. No significant coronary
artery calcification is noted.

Mediastinum/Nodes: No mediastinal or hilar adenopathy is seen. No
esophageal abnormality is evident by CT.

Lungs/Pleura: On lung window images, no pneumonia or effusion is
seen. No suspicious lung nodule or mass is evident. Linear scarring
or atelectasis is noted in the left lower lobe and within the
lingula. No pleural effusion is seen.

Upper Abdomen: Within the upper abdomen, no abnormality is evident.

Musculoskeletal: The thoracic vertebrae are normal alignment. There
is degenerative change at the sternomanubrial junction.

Review of the MIP images confirms the above findings.
IMPRESSION: 1. No evidence of acute pulmonary embolism. The pulmonary arteries
are well opacified. No abnormality of the thoracic aorta is noted.
2. No lung infiltrate or pleural effusion. Linear atelectasis or
scarring in both lower lobes. No pleural effusion.
3. Degenerative change at the sternomanubrial junction.

## 2020-08-13 ENCOUNTER — Other Ambulatory Visit: Payer: Self-pay

## 2020-08-13 ENCOUNTER — Emergency Department (HOSPITAL_COMMUNITY)
Admission: EM | Admit: 2020-08-13 | Discharge: 2020-08-13 | Disposition: A | Discharge status: Home / Self Care | Payer: BLUE CROSS/BLUE SHIELD | Attending: Emergency Medicine | Admitting: Emergency Medicine

## 2020-08-13 DIAGNOSIS — F17210 Nicotine dependence, cigarettes, uncomplicated: Secondary | ICD-10-CM | POA: Insufficient documentation

## 2020-08-13 DIAGNOSIS — J069 Acute upper respiratory infection, unspecified: Secondary | ICD-10-CM

## 2020-08-13 DIAGNOSIS — U071 COVID-19: Secondary | ICD-10-CM | POA: Insufficient documentation

## 2020-08-13 LAB — RESP PANEL BY RT-PCR (FLU A&B, COVID) ARPGX2
Influenza A by PCR: NEGATIVE
Influenza B by PCR: NEGATIVE
SARS Coronavirus 2 by RT PCR: POSITIVE — AB

## 2020-08-13 NOTE — ED Triage Notes (Signed)
Pt reports being exposed to covid. C/o runny nose and generalized aches.

## 2020-08-13 NOTE — ED Provider Notes (Signed)
[facility] EMERGENCY DEPARTMENT Provider Note   CSN: [id number] Arrival date & time: 08/13/20  1340     History No chief complaint on file.   Colton Sanders is a 45 y.o. male.  HPI  Patient presents with body aches, fever, congestion x7 days.  His mother and brother whom he lives with tested positive for COVID yesterday and the day before surgery.  He has not tried any alleviating factors, does not know any aggravating factors.  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    Past Surgical History:  Procedure Laterality Date   FRACTURE SURGERY     TOOTH EXTRACTION  05/2011       Family History  Problem Relation Age of Onset   Diabetes Mother    Diabetes Father    Hypertension Father    Stroke Other     Social History   Tobacco Use   Smoking status: Every Day    Packs/day: 1.00    Types: Cigars, Cigarettes   Smokeless tobacco: Never  Substance Use Topics   Alcohol use: Yes    Alcohol/week: 3.0 standard drinks    Types: 3 Cans of beer per week    Comment: occ   Drug use: Yes    Types: Marijuana    Home Medications Prior to Admission medications   Medication Sig Start Date End Date Taking? Authorizing Provider  dexamethasone (DECADRON) 4 MG tablet Take 1 tablet (4 mg total) by mouth 2 (two) times daily with a meal. 01/21/18   Ivery Quale, PA-C  diclofenac (VOLTAREN) 75 MG EC tablet Take 1 tablet (75 mg total) by mouth 2 (two) times daily. 01/21/18   Ivery Quale, PA-C  HYDROcodone-acetaminophen (NORCO/VICODIN) 5-325 MG per tablet Take 2 tablets by mouth every 4 (four) hours as needed. Patient not taking: Reported on 03/31/2014 01/16/14   Eber Hong, MD  ibuprofen (ADVIL,MOTRIN) 200 MG tablet Take 400 mg by mouth every 6 (six) hours as needed for pain.     [provider]  naproxen (NAPROSYN) 500 MG tablet Take 1 tablet (500 mg total) by mouth 2 (two) times daily with a meal. Patient not taking: Reported on 03/31/2014 01/16/14   Eber Hong, MD  naproxen (NAPROSYN) 500 MG tablet Take 1 tablet (500 mg total) by mouth 2 (two) times daily. 10/01/17   Vanetta Mulders, MD    Allergies    Bee venom  Review of Systems   Review of Systems  Constitutional:  Positive for fever.  HENT:  Positive for congestion.   Musculoskeletal:  Positive for myalgias.   Physical Exam Updated Vital Signs BP 110/84 (BP Location: Right Arm)   Pulse 85   Temp 98.7 F (37.1 C) (Oral)   Resp 18   Ht 6\' 3"  (1.905 m)   Wt 72.6 kg   SpO2 97%   BMI 20.00 kg/m   Physical Exam Vitals and nursing note reviewed. Exam conducted with a chaperone present.  Constitutional:      General: He is not in acute distress.    Appearance: Normal appearance.  HENT:     Head: Normocephalic and atraumatic.  Eyes:     General: No scleral icterus.    Extraocular Movements: Extraocular movements intact.  Pupils: Pupils are equal, round, and reactive to light.  Skin:    Coloration: Skin is not jaundiced.  Neurological:     Mental Status: He is alert. Mental status is at baseline.     Coordination: Coordination normal.    ED Results / Procedures / Treatments   Labs (all labs ordered are listed, but only abnormal results are displayed) Labs Reviewed  RESP PANEL BY RT-PCR (FLU A&B, COVID) [id number]    EKG None  Radiology No results found.  Procedures Procedures   Medications Ordered in ED Medications - No data to display  ED Course  I have reviewed the triage vital signs and the nursing notes.  Pertinent labs & imaging results that were available during my care of the patient were reviewed by me and considered in my medical decision making (see chart for details).    MDM Rules/Calculators/A&P                           Patient is COVID vaccinated,  with 2 positive exposures in the last week.  His vitals are stable, his lung sounds are clear to auscultation.  We will test for COVID swabs and send patient home with quarantine instructions.  Return precautions discussed and agreed upon.  Final Clinical Impression(s) / ED Diagnoses Final diagnoses:  Viral URI    Rx / DC Orders ED Discharge Orders     None        Theron Arista, PA-C 08/13/20 1430    Cathren Laine, MD 08/15/20 [phone]

## 2020-08-13 NOTE — Discharge Instructions (Addendum)
Please quarantine for 5 days, after that you may return to work if you are feeling better.    Can take Tylenol as needed for body aches.

## 2022-08-15 ENCOUNTER — Emergency Department (HOSPITAL_COMMUNITY): Payer: Self-pay

## 2022-08-15 ENCOUNTER — Emergency Department (HOSPITAL_COMMUNITY): Admission: EM | Admit: 2022-08-15 | Discharge: 2022-08-15 | Discharge status: Against Medical Advice | Payer: Self-pay

## 2022-08-15 ENCOUNTER — Emergency Department (HOSPITAL_COMMUNITY)
Admission: EM | Admit: 2022-08-15 | Discharge: 2022-08-16 | Disposition: A | Discharge status: Home / Self Care | Payer: Self-pay | Attending: Emergency Medicine | Admitting: Emergency Medicine

## 2022-08-15 ENCOUNTER — Other Ambulatory Visit: Payer: Self-pay

## 2022-08-15 ENCOUNTER — Encounter (HOSPITAL_COMMUNITY): Payer: Self-pay

## 2022-08-15 DIAGNOSIS — W19XXXA Unspecified fall, initial encounter: Secondary | ICD-10-CM

## 2022-08-15 DIAGNOSIS — W010XXA Fall on same level from slipping, tripping and stumbling without subsequent striking against object, initial encounter: Secondary | ICD-10-CM | POA: Insufficient documentation

## 2022-08-15 DIAGNOSIS — M25551 Pain in right hip: Secondary | ICD-10-CM

## 2022-08-15 NOTE — ED Triage Notes (Signed)
Ortho cast on RIGHT wrist from fall 3 weeks ago.   Fell last night, RIGHT wrist has been numb and tingling since. RIGHT hip pain  Checked in hours ago. Left prior to triage, came back

## 2022-08-16 MED ORDER — OXYCODONE HCL 5 MG PO TABS
5.0000 mg | ORAL_TABLET | Freq: Once | ORAL | Status: AC
  Administered 2022-08-16: 5 mg via ORAL
  Filled 2022-08-16: qty 1

## 2022-08-16 MED ORDER — NAPROXEN 500 MG PO TABS: 500.0000 mg | ORAL_TABLET | Freq: Two times a day (BID) | ORAL | 0 refills | Status: DC

## 2022-08-16 NOTE — Discharge Instructions (Signed)
You were evaluated in the Emergency Department and after careful evaluation, we did not find any emergent condition requiring admission or further testing in the hospital.  Your exam/testing today is overall reassuring.  Recommend follow-up with your orthopedic specialist regarding your wrist injury.  Use the Naprosyn anti-inflammatory for pain for your hip.  Please return to the Emergency Department if you experience any worsening of your condition.   Thank you for allowing [place] to be a part of your care.

## 2022-08-16 NOTE — ED Provider Notes (Signed)
[facility] DE[facility] Emergency Department Provider Note MRN:  [id number]  Arrival date & time: 08/16/22     Chief Complaint   Fall   History of Present Illness   Colton Sanders is a 47 y.o. year-old male with no pertinent past medical history presenting to the ED with chief complaint of fall.  Slipped and fell onto right side.  Increased pain to the right wrist, pain to the right hip.  Denies head trauma, no loss of consciousness, no neck or back pain, no numbness or weakness to the arms or legs, no bowel or bladder dysfunction, having some paresthesias to the fingers since the fall and the original injury to the wrist several days ago.  Review of Systems  A thorough review of systems was obtained and all systems are negative except as noted in the HPI and PMH.   Patient's Health History    Past Medical History:  Diagnosis Date   Migraine    Pulmonary emboli (HCC)    Sixth nerve palsy     Past Surgical History:  Procedure Laterality Date   FRACTURE SURGERY     TOOTH EXTRACTION  05/2011    Family History  Problem Relation Age of Onset   Diabetes Mother    Diabetes Father    Hypertension Father    Stroke Other     Social History   Socioeconomic History   Marital status: Single    Spouse name: Not on file   Number of children: Not on file   Years of education: Not on file   Highest education level: Not on file  Occupational History   Not on file  Tobacco Use   Smoking status: Every Day    Current packs/day: 1.00    Types: Cigars, Cigarettes   Smokeless tobacco: Never  Substance and Sexual Activity   Alcohol use: Yes    Alcohol/week: 3.0 standard drinks of alcohol    Types: 3 Cans of beer per week    Comment: occ   Drug use: Yes    Types: Marijuana   Sexual activity: Yes    Birth control/protection: None  Other Topics Concern   Not on file  Social History Narrative   Not on file   Social Determinants of Health   Financial Resource  Strain: Not on File (11/04/2018)   Received from [employer]    Financial Resource Strain: 0  Food Insecurity: Not on File (11/04/2018)   Received from [facility] Insecurity    Food: 0  Transportation Needs: Not on File (11/04/2018)   Received from [employer] Needs    Transportation: 0  Physical Activity: Not on File (11/04/2018)   Received from [facility]   Physical Activity    Physical Activity: 0  Stress: Not on File (11/04/2018)   Received from [facility]   Stress    Stress: 0  Social Connections: Not on File (11/04/2018)   Received from [facility]   Social Connections    Social Connections and Isolation: 0  Intimate Partner Violence: Not on file     Physical Exam   Vitals:   08/15/22 2350 08/15/22 2350  BP:    Pulse: 96   Resp:    Temp:  98.8 F (37.1 C)  SpO2: 98%     CONSTITUTIONAL: Well-appearing, NAD NEURO/PSYCH:  Alert and oriented x 3, no focal deficits EYES:  eyes equal and reactive ENT/NECK:  no LAD, no JVD CARDIO: Regular rate,  well-perfused, normal S1 and S2 PULM:  CTAB no wheezing or rhonchi GI/GU:  non-distended, non-tender MSK/SPINE:  No gross deformities, no edema SKIN:  no rash, atraumatic   *Additional and/or pertinent findings included in MDM below  Diagnostic and Interventional Summary    EKG Interpretation Date/Time:    Ventricular Rate:    PR Interval:    QRS Duration:    QT Interval:    QTC Calculation:   R Axis:      Text Interpretation:         Labs Reviewed - No data to display  DG Hip Unilat W or Wo Pelvis 2-3 Views Right  Final Result    DG Wrist Complete Right  Final Result      Medications  oxyCODONE (Oxy IR/ROXICODONE) immediate release tablet 5 mg (5 mg Oral Given 08/16/22 0115)     Procedures  /  Critical Care Procedures  ED Course and Medical Decision Making  Initial Impression and Ddx No signs of head trauma, normal vital signs, clear lungs, soft abdomen, no spinal tenderness.   Pain to the lateral right hip, preserved range of motion.  X-ray to exclude fracture.  Neurovascularly intact.  The right wrist has a very loose splint that needs to be replaced.  Per chart review he has a remote history of ulnar fracture with hardware.  More recently he fell and broke his radius, he was seen in the emergency department at [facility]  He has not follow-up with orthopedics as of yet.  X-ray to evaluate for worsened or new fracture.  Past medical/surgical history that increases complexity of ED encounter: None  Interpretation of Diagnostics I personally reviewed the wrist and hip x-ray and my interpretation is as follows: Distal radius fracture, no hip fracture    Patient Reassessment and Ultimate Disposition/Management     Discharge  Patient management required discussion with the following services or consulting groups:  None  Complexity of Problems Addressed Acute complicated illness or Injury  Additional Data Reviewed and Analyzed Further history obtained from: None  Additional Factors Impacting ED Encounter Risk None  Elmer Sow. Pilar Plate, MD [facility] Emergency Medicine [facility] [email or web address]  Final Clinical Impressions(s) / ED Diagnoses     ICD-10-CM   1. Fall, initial encounter  W19.XXXA     2. Pain of right hip  M25.551       ED Discharge Orders          Ordered    naproxen (NAPROSYN) 500 MG tablet  2 times daily        08/16/22 0119             Discharge Instructions Discussed with and Provided to Patient:    Discharge Instructions      You were evaluated in the Emergency Department and after careful evaluation, we did not find any emergent condition requiring admission or further testing in the hospital.  Your exam/testing today is overall reassuring.  Recommend follow-up with your orthopedic specialist regarding your wrist injury.  Use the Naprosyn anti-inflammatory for pain for your hip.  Please return to the  Emergency Department if you experience any worsening of your condition.   Thank you for allowing [place] to be a part of your care.      Sabas Sous, MD 08/16/22 [phone]

## 2022-11-15 ENCOUNTER — Emergency Department (HOSPITAL_COMMUNITY)
Admission: EM | Admit: 2022-11-15 | Discharge: 2022-11-15 | Disposition: A | Discharge status: Home / Self Care | Payer: MEDICAID | Attending: Emergency Medicine | Admitting: Emergency Medicine

## 2022-11-15 ENCOUNTER — Emergency Department (HOSPITAL_COMMUNITY): Payer: MEDICAID

## 2022-11-15 ENCOUNTER — Encounter (HOSPITAL_COMMUNITY): Payer: Self-pay

## 2022-11-15 ENCOUNTER — Other Ambulatory Visit: Payer: Self-pay

## 2022-11-15 DIAGNOSIS — Y9389 Activity, other specified: Secondary | ICD-10-CM | POA: Diagnosis not present

## 2022-11-15 DIAGNOSIS — X58XXXA Exposure to other specified factors, initial encounter: Secondary | ICD-10-CM | POA: Insufficient documentation

## 2022-11-15 DIAGNOSIS — M545 Low back pain, unspecified: Secondary | ICD-10-CM | POA: Insufficient documentation

## 2022-11-15 DIAGNOSIS — S63501A Unspecified sprain of right wrist, initial encounter: Secondary | ICD-10-CM | POA: Insufficient documentation

## 2022-11-15 DIAGNOSIS — M25531 Pain in right wrist: Secondary | ICD-10-CM | POA: Diagnosis present

## 2022-11-15 MED ORDER — CYCLOBENZAPRINE HCL 5 MG PO TABS: 10.0000 mg | ORAL_TABLET | Freq: Two times a day (BID) | ORAL | 0 refills | Status: DC | PRN

## 2022-11-15 MED ORDER — KETOROLAC TROMETHAMINE 15 MG/ML IJ SOLN
15.0000 mg | Freq: Once | INTRAMUSCULAR | Status: AC
  Administered 2022-11-15: 15 mg via INTRAMUSCULAR
  Filled 2022-11-15: qty 1

## 2022-11-15 MED ORDER — LIDOCAINE 5 % EX PTCH
1.0000 | MEDICATED_PATCH | CUTANEOUS | Status: DC
  Administered 2022-11-15: 1 via TRANSDERMAL
  Filled 2022-11-15: qty 1

## 2022-11-15 NOTE — ED Triage Notes (Signed)
Pt arrives with c/o pain to right wrist states that he broke it over the summer and never had follow up with specialist. Also c/o pain in back. Pt states he has been chopping wood and it has made his wrist and back pain worse. Pt has been taking ibuprofen for pain at home.

## 2022-11-15 NOTE — ED Notes (Signed)
See triage notes. Pt had wrist splint on. Swelling noted from just above R wrist into hand and fingers. Radial pulses wnl. Rom limited due to pain. Smells of etoh

## 2022-11-15 NOTE — ED Provider Notes (Signed)
[place] EMERGENCY DEPARTMENT AT [facility] Provider Note   CSN: [id number] Arrival date & time: 11/15/22  1450     History  Chief Complaint  Patient presents with   Wrist Pain    Colton Sanders is a 47 y.o. male with medical history of migraine, pulmonary emboli, VI nerve palsy.  Patient presents to ED for evaluation of right wrist pain, diffuse low back pain.  Patient reports that he has been chopping wood for the last 4 days and dissipation of colder temperatures.  States that he was diagnosed with distal radial fracture in July at [facility] however never followed up with specialist.  States that he was in a cast at 1 point however had cast removed and he has been wearing a brace ever since then.  He reports that he has been chopping wood for the last 4 days and this is what he attributes his increased right wrist pain to.  He also reports that he has diffuse low back pain that does not radiate down his legs.  States that this back pain started after chopping wood.  He denies any bilateral incontinence, groin numbness, distal extremity weakness.  He reports that he has been drinking alcohol and taking ibuprofen to help deal with the pain.  Patient is somnolent on examination however alert and oriented x 4.  Denies fevers, dysuria, nausea or vomiting.   Wrist Pain       Home Medications Prior to Admission medications   Medication Sig Start Date End Date Taking? Authorizing Provider  dexamethasone (DECADRON) 4 MG tablet Take 1 tablet (4 mg total) by mouth 2 (two) times daily with a meal. 01/21/18   Ivery Quale, PA-C  diclofenac (VOLTAREN) 75 MG EC tablet Take 1 tablet (75 mg total) by mouth 2 (two) times daily. 01/21/18   Ivery Quale, PA-C  HYDROcodone-acetaminophen (NORCO/VICODIN) 5-325 MG per tablet Take 2 tablets by mouth every 4 (four) hours as needed. Patient not taking: Reported on 03/31/2014 01/16/14   Eber Hong, MD  ibuprofen (ADVIL,MOTRIN) 200 MG  tablet Take 400 mg by mouth every 6 (six) hours as needed for pain.    [provider]  naproxen (NAPROSYN) 500 MG tablet Take 1 tablet (500 mg total) by mouth 2 (two) times daily. 08/16/22   Sabas Sous, MD      Allergies    Bee venom    Review of Systems   Review of Systems  Musculoskeletal:  Positive for arthralgias and back pain.  All other systems reviewed and are negative.   Physical Exam Updated Vital Signs BP 138/80 (BP Location: Left Arm)   Pulse 100   Temp 98.5 F (36.9 C) (Oral)   Resp 18   Ht 6\' 3"  (1.905 m)   Wt 72.6 kg   SpO2 96%   BMI 20.00 kg/m  Physical Exam Vitals and nursing note reviewed.  Constitutional:      General: He is not in acute distress.    Appearance: Normal appearance. He is not ill-appearing, toxic-appearing or diaphoretic.  HENT:     Head: Normocephalic and atraumatic.     Nose: Nose normal.     Mouth/Throat:     Mouth: Mucous membranes are moist.     Pharynx: Oropharynx is clear.  Eyes:     Extraocular Movements: Extraocular movements intact.     Conjunctiva/sclera: Conjunctivae normal.     Pupils: Pupils are equal, round, and reactive to light.  Cardiovascular:  Rate and Rhythm: Normal rate and regular rhythm.  Pulmonary:     Effort: Pulmonary effort is normal.     Breath sounds: Normal breath sounds. No wheezing.  Abdominal:     General: Abdomen is flat. Bowel sounds are normal.     Palpations: Abdomen is soft.     Tenderness: There is no abdominal tenderness.  Musculoskeletal:     Right wrist: Swelling and tenderness present. No deformity or effusion. Decreased range of motion.     Cervical back: Normal range of motion and neck supple. No tenderness.       Back:     Comments: Patient does have soft tissue swelling, reduced range of motion secondary to pain.  2+ radial pulse noted.  Brisk capillary refill.  No deformity.  Circumscribed area above notes patient location of low back pain.  Skin:    General:  Skin is warm and dry.     Capillary Refill: Capillary refill takes less than 2 seconds.  Neurological:     Mental Status: He is alert and oriented to person, place, and time.     Comments: 5 out of 5 strength bilateral lower extremities     ED Results / Procedures / Treatments   Labs (all labs ordered are listed, but only abnormal results are displayed) Labs Reviewed - No data to display  EKG None  Radiology DG Wrist Complete Right  Result Date: 11/15/2022 CLINICAL DATA:  Right wrist pain.  History of distal radial fracture EXAM: RIGHT WRIST - COMPLETE 3+ VIEW COMPARISON:  08/15/2022 FINDINGS: Healed fracture deformity of the distal radial metaphysis with mature bridging callus formation. Well-healed ulnar diaphyseal fracture with intact plate and screw fixation construct. No acute fractures identified. No dislocation. 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 Soft tissues within normal limits. IMPRESSION: 1. No acute fracture or dislocation of the right wrist. 2. Healed fracture deformities of the distal radius and ulna. Electronically Signed   By: Duanne Guess D.O.   On: 11/15/2022 17:07   DG Lumbar Spine Complete  Result Date: 11/15/2022 CLINICAL DATA:  Back pain EXAM: LUMBAR SPINE - COMPLETE 4+ VIEW COMPARISON:  None Available. FINDINGS: There is no evidence of lumbar spine fracture. Alignment is normal. Mild anterior endplate spurring. Intervertebral disc spaces are maintained. Minimal degenerative changes of the L5-S1 facet joints. Aortic atherosclerosis. IMPRESSION: Minimal degenerative changes of the lumbar spine. No acute findings. Electronically Signed   By: Duanne Guess D.O.   On: 11/15/2022 17:03    Procedures Procedures   Medications Ordered in ED Medications  lidocaine (LIDODERM) 5 % 1 patch (1 patch Transdermal Patch Applied 11/15/22 1703)  ketorolac (TORADOL) 15 MG/ML injection 15 mg (15 mg Intramuscular  Given 11/15/22 1703)    ED Course/ Medical Decision Making/ A&P  Medical Decision Making Amount and/or Complexity of Data Reviewed Radiology: ordered.  Risk Prescription drug management.   47 year old male presents to the ED for evaluation of right wrist pain and low back pain.  Please see HPI for further details.  On examination patient is very somnolent, room smells heavily of EtOH.  Patient endorses drinking a pint of vodka earlier today for pain.  He has soft tissue swelling to his right wrist however no deformity, he does have reduced range of motion secondary to pain and swelling.  2+ radial pulse with brisk capillary refill noted.  He arrives with a wrist brace in place. 1 patient also has low back pain  but no CVA tenderness bilaterally.  His back pain is located throughout his entire low back region.  He has 5 out of 5 strength bilateral lower extremities so doubt cauda equina.  Patient x-ray of right wrist shows a healed fracture compared to x-ray in July.  His x-ray of his lumbar spine shows no acute process.  Patient was given Toradol shot, Lidoderm patch.  Patient will be advised to continue taking ibuprofen and Tylenol at home for pain.  Will refer him to orthopedics for further management.   Final Clinical Impression(s) / ED Diagnoses Final diagnoses:  Sprain of right wrist, initial encounter  Acute bilateral low back pain without sciatica    Rx / DC Orders ED Discharge Orders     None         Clent Ridges 11/15/22 1849    Durwin Glaze, MD 11/15/22 2356

## 2022-11-15 NOTE — ED Notes (Signed)
Pt taken to xray 

## 2022-11-15 NOTE — ED Notes (Addendum)
Pt still not in room.was going to notify edp when seen pt coming back into room.

## 2022-11-15 NOTE — Discharge Instructions (Addendum)
It was a pleasure taking part in your care today.  As we discussed, the x-ray of your right wrist shows a healed fracture compared to the x-ray taken back in July.  Please continue wearing the wrist brace.  Please continue taking ibuprofen or Tylenol every 6 hours as needed for pain.  Please try and avoid any further wood chopping as I feel as this is exacerbating your wrist pain.  He may take Flexeril, muscle relaxer, twice a day as needed for pain in your low back.  Please do not drive or operate heavy machinery while taking this medication.  Please follow-up with orthopedics this week for further management.

## 2022-11-15 NOTE — ED Notes (Signed)
Pt not in room. No belongings left.

## 2023-01-30 ENCOUNTER — Other Ambulatory Visit: Payer: Self-pay

## 2023-01-30 ENCOUNTER — Encounter (HOSPITAL_BASED_OUTPATIENT_CLINIC_OR_DEPARTMENT_OTHER): Payer: Self-pay | Admitting: Orthopedic Surgery

## 2023-01-30 ENCOUNTER — Other Ambulatory Visit: Payer: Self-pay | Admitting: Orthopedic Surgery

## 2023-02-03 NOTE — Anesthesia Preprocedure Evaluation (Signed)
Anesthesia Evaluation  Patient identified by MRN, date of birth, ID band Patient awake    Reviewed: Allergy & Precautions, H&P , NPO status , Patient's Chart, lab work & pertinent test results  Airway        Dental   Pulmonary Current Smoker          Cardiovascular   Hx of PE   Neuro/Psych  Headaches PSYCHIATRIC DISORDERS Anxiety Depression     Neuromuscular disease    GI/Hepatic negative GI ROS, Neg liver ROS,,,  Endo/Other  negative endocrine ROS    Renal/GU negative Renal ROS  negative genitourinary   Musculoskeletal negative musculoskeletal ROS (+)    Abdominal   Peds negative pediatric ROS (+)  Hematology negative hematology ROS (+)   Anesthesia Other Findings  RIGHT CARPAL TUNNEL SYNDROME  Reproductive/Obstetrics negative OB ROS                             Anesthesia Physical Anesthesia Plan  ASA: 2  Anesthesia Plan: MAC   Post-op Pain Management:    Induction: Intravenous  PONV Risk Score and Plan: Propofol infusion and Treatment may vary due to age or medical condition  Airway Management Planned: Natural Airway  Additional Equipment:   Intra-op Plan:   Post-operative Plan:   Informed Consent: I have reviewed the patients History and Physical, chart, labs and discussed the procedure including the risks, benefits and alternatives for the proposed anesthesia with the patient or authorized representative who has indicated his/her understanding and acceptance.     Dental advisory given  Plan Discussed with: CRNA  Anesthesia Plan Comments:        Anesthesia Quick Evaluation

## 2023-02-04 ENCOUNTER — Encounter (HOSPITAL_BASED_OUTPATIENT_CLINIC_OR_DEPARTMENT_OTHER): Payer: Self-pay

## 2023-02-04 ENCOUNTER — Ambulatory Visit (HOSPITAL_BASED_OUTPATIENT_CLINIC_OR_DEPARTMENT_OTHER): Admission: RE | Admit: 2023-02-04 | Payer: MEDICAID | Source: Home / Self Care | Admitting: Orthopedic Surgery

## 2023-02-04 HISTORY — DX: Depression, unspecified: F32.A

## 2023-02-04 HISTORY — DX: Anxiety disorder, unspecified: F41.9

## 2023-02-04 HISTORY — DX: Carpal tunnel syndrome, unspecified upper limb: G56.00

## 2023-02-04 SURGERY — CARPAL TUNNEL RELEASE
Anesthesia: Monitor Anesthesia Care | Laterality: Right

## 2023-03-31 ENCOUNTER — Other Ambulatory Visit: Payer: Self-pay | Admitting: Orthopedic Surgery

## 2023-04-02 ENCOUNTER — Other Ambulatory Visit: Payer: Self-pay

## 2023-04-02 ENCOUNTER — Encounter (HOSPITAL_BASED_OUTPATIENT_CLINIC_OR_DEPARTMENT_OTHER): Payer: Self-pay | Admitting: Orthopedic Surgery

## 2023-04-07 NOTE — H&P (Signed)
 History: CC / Reason for Visit: Right hand numbness and tingling HPI: This patient is a 48 year old male who presents for evaluation of his right hand.  He reports sustaining a left ulnar shaft fracture which was plated in 2013, and then this past July sustained a distal radius fracture that was treated nonoperatively and this resulted in some degree of dorsal tilt nonunion.  However he has more recently developed numbness and tingling specifically involving the index, long, and ring fingers which he reports are numb and tingling all the time.  He is wearing a removable wrist splint during the day but not nocturnally.  His hand will often awaken him from sleep.  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    Exam:  Vitals: Refer to EMR. Constitutional:  WD, WN, NAD HEENT:  NCAT, EOMI Neuro/Psych:  Alert & oriented to person, place, and time; appropriate mood & affect Lymphatic: No generalized UE edema or lymphadenopathy Extremities / MSK:  Both UE are normal with respect to appearance, ranges of motion, joint stability, muscle strength/tone, sensation, & perfusion except as otherwise noted:  The right wrist as a dorsal angular deformity.  Flexes to 35, extends to 70, fairly full pronation and supination, with grip strength in position with 2: Right 65/left 125 with only mild pain on the right.  Monofilaments measured 3.61 across all digits on the right, with subjectively altered sensibility in the median distribution with appropriate ring finger splitting.  Positive Tinel sign over the median nerve at the wrist.  Labs / Xrays:  No radiographic studies obtained today.  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  No paresthesias or lancinating pain was reported.   A Band-Aid was applied.  Plan:  We discussed these findings and how they relate to one another.  We discussed how the relative treatments for each are quite different.  After careful consideration and deliberation indicated like to proceed with more minimal treatment for his carpal tunnel symptoms, which I think is reasonable.  He is referred to Dr. Regino Schultze for NCS/EMG, and I flatten his night splint to place his wrist in neutral and encouraged him to wear it just at nighttime.  We will schedule his right endoscopic carpal tunnel release to occur some point after his NCS/EMG which will occur on 01-28-23.  I will review the studies and ensure that the evidence is corroborating.  The details of the operative procedure were discussed with the patient.  Questions were invited and answered.  The goal of the procedure was reviewed.  The risks of the procedure includes but is not limited to bleeding; infection; damage to the nerves or blood vessels that could result in bleeding, numbness, weakness, chronic pain, and the need for additional procedures; stiffness; the need for revision surgery; and anesthetic risks, including death. No specific outcome was guaranteed or implied.  Informed  consent was obtained.  Work status: All interested parties should please consider this patient to be out of work entirely if no work is available that complies with the restrictions detailed below (if any).  If these restrictions result in the patient being out of work entirely, the  employer is expected to provide documentation of such to all interested third parties such as disability insurance companies: With the right hand, no lifting, gripping, grasping greater than 5 pounds, no using vibratory tools and no repetitive motion work 1 month.

## 2023-04-08 ENCOUNTER — Ambulatory Visit (HOSPITAL_BASED_OUTPATIENT_CLINIC_OR_DEPARTMENT_OTHER): Admission: RE | Admit: 2023-04-08 | Payer: MEDICAID | Source: Home / Self Care | Admitting: Orthopedic Surgery

## 2023-04-08 ENCOUNTER — Encounter (HOSPITAL_BASED_OUTPATIENT_CLINIC_OR_DEPARTMENT_OTHER): Admission: RE | Payer: Self-pay | Source: Home / Self Care

## 2023-04-08 DIAGNOSIS — Z01818 Encounter for other preprocedural examination: Secondary | ICD-10-CM

## 2023-04-08 SURGERY — RELEASE, CARPAL TUNNEL, ENDOSCOPIC
Anesthesia: Monitor Anesthesia Care | Laterality: Right

## 2023-04-08 MED ORDER — CEFAZOLIN SODIUM-DEXTROSE 2-4 GM/100ML-% IV SOLN: 2.0000 g | INTRAVENOUS | Status: AC

## 2023-04-08 MED ORDER — LACTATED RINGERS IV SOLN: INTRAVENOUS | Status: AC

## 2023-06-09 ENCOUNTER — Other Ambulatory Visit: Payer: Self-pay | Admitting: Orthopedic Surgery

## 2023-06-09 ENCOUNTER — Encounter (HOSPITAL_BASED_OUTPATIENT_CLINIC_OR_DEPARTMENT_OTHER): Payer: Self-pay | Admitting: Orthopedic Surgery

## 2023-06-09 ENCOUNTER — Other Ambulatory Visit: Payer: Self-pay

## 2023-06-09 NOTE — H&P (Signed)
  History: CC / Reason for Visit: Bilateral hand numbness and tingling HPI: This patient is a 48 year old male who presents for reevaluation of numbness and tingling, now reportedly also affecting his left hand and somewhat in his right foot and leg.  He has been previously scheduled for carpal tunnel release twice earlier this year and failed to arrive for surgery as planned, having also missed at least 2 clinic appointments during the same time.Aaron Aas  He now presents and would like to proceed with planning again for carpal tunnel release on the right.  HPI 01/20/2023: This patient is a 48 year old male who presents for evaluation of his right hand.  He reports sustaining a left ulnar shaft fracture which was plated in 2013, and then this past July sustained a distal radius fracture that was treated nonoperatively and this resulted in some degree of dorsal tilt nonunion.  However he has more recently developed numbness and tingling specifically involving the index, long, and ring fingers which he reports are numb and tingling all the time.  He is wearing a removable wrist splint during the day but not nocturnally.  His hand will often awaken him from sleep.  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  Exam:  Vitals: Refer to EMR. Constitutional:  WD, WN, NAD HEENT:  NCAT, EOMI Neuro/Psych:  Alert & oriented to person, place, and time; appropriate mood & affect Lymphatic: No generalized UE edema or lymphadenopathy Extremities / MSK:  Both UE are normal with respect to appearance, ranges of motion, joint stability, muscle strength/tone, sensation, & perfusion except as otherwise noted:  The right wrist as a dorsal angular deformity.  Flexes to 35, extends to 70, fairly full pronation and  supination, with grip strength in position with 2: Right 75/left 110 with no pain on the right.  Monofilaments measured 3.61 across all digits on the right, with subjectively altered sensibility in the median distribution with appropriate ring finger splitting.  Positive Tinel sign over the median nerve at the wrist.  Labs / Xrays:  No radiographic studies obtained today.  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  I indicated that we would work to get him scheduled for surgical release again on the right side, and I reviewed the details of the 2 Incision Endoscopic Carpal Tunl. release.  We will actually add him onto the surgery schedule tomorrow and he confirms and make that happen, with surgery in the early afternoon.    The details of the operative procedure were discussed with the patient.  Questions were invited and answered.  The goal of the procedure was reviewed.  The risks of the procedure includes but is not limited to bleeding; infection; damage to the nerves or blood vessels that could result in bleeding, numbness, weakness, chronic pain, and the need for additional procedures; stiffness; the need for revision surgery; and anesthetic risks, including death. No specific outcome was guaranteed or implied.  Informed consent was obtained.

## 2023-06-10 ENCOUNTER — Encounter (HOSPITAL_BASED_OUTPATIENT_CLINIC_OR_DEPARTMENT_OTHER): Payer: Self-pay | Admitting: Orthopedic Surgery

## 2023-06-10 ENCOUNTER — Ambulatory Visit (HOSPITAL_BASED_OUTPATIENT_CLINIC_OR_DEPARTMENT_OTHER)
Admission: RE | Admit: 2023-06-10 | Discharge: 2023-06-10 | Disposition: A | Discharge status: Home / Self Care | Payer: MEDICAID | Attending: Orthopedic Surgery | Admitting: Orthopedic Surgery

## 2023-06-10 ENCOUNTER — Encounter (HOSPITAL_BASED_OUTPATIENT_CLINIC_OR_DEPARTMENT_OTHER)
Admission: RE | Disposition: A | Discharge status: Home / Self Care | Payer: Self-pay | Source: Home / Self Care | Attending: Orthopedic Surgery

## 2023-06-10 ENCOUNTER — Other Ambulatory Visit: Payer: Self-pay

## 2023-06-10 ENCOUNTER — Ambulatory Visit (HOSPITAL_BASED_OUTPATIENT_CLINIC_OR_DEPARTMENT_OTHER): Payer: MEDICAID | Admitting: Anesthesiology

## 2023-06-10 DIAGNOSIS — F418 Other specified anxiety disorders: Secondary | ICD-10-CM

## 2023-06-10 DIAGNOSIS — Z86711 Personal history of pulmonary embolism: Secondary | ICD-10-CM | POA: Diagnosis not present

## 2023-06-10 DIAGNOSIS — G5601 Carpal tunnel syndrome, right upper limb: Secondary | ICD-10-CM | POA: Insufficient documentation

## 2023-06-10 DIAGNOSIS — F17200 Nicotine dependence, unspecified, uncomplicated: Secondary | ICD-10-CM | POA: Insufficient documentation

## 2023-06-10 HISTORY — PX: CARPAL TUNNEL RELEASE: SHX101

## 2023-06-10 SURGERY — RELEASE, CARPAL TUNNEL, ENDOSCOPIC
Anesthesia: Monitor Anesthesia Care | Laterality: Right

## 2023-06-10 MED ORDER — FENTANYL CITRATE (PF) 100 MCG/2ML IJ SOLN: 25.0000 ug | INTRAMUSCULAR | Status: DC | PRN

## 2023-06-10 MED ORDER — DEXMEDETOMIDINE HCL IN NACL 80 MCG/20ML IV SOLN
INTRAVENOUS | Status: DC | PRN
  Administered 2023-06-10 (×2): 4 ug via INTRAVENOUS

## 2023-06-10 MED ORDER — ACETAMINOPHEN 325 MG PO TABS: 650.0000 mg | ORAL_TABLET | Freq: Four times a day (QID) | ORAL | Status: AC

## 2023-06-10 MED ORDER — FENTANYL CITRATE (PF) 100 MCG/2ML IJ SOLN
INTRAMUSCULAR | Status: DC | PRN
  Administered 2023-06-10 (×2): 50 ug via INTRAVENOUS

## 2023-06-10 MED ORDER — PROPOFOL 500 MG/50ML IV EMUL
INTRAVENOUS | Status: DC | PRN
  Administered 2023-06-10: 150 ug/kg/min via INTRAVENOUS

## 2023-06-10 MED ORDER — LACTATED RINGERS IV SOLN: INTRAVENOUS | Status: DC

## 2023-06-10 MED ORDER — PROPOFOL 10 MG/ML IV BOLUS
INTRAVENOUS | Status: DC | PRN
  Administered 2023-06-10: 30 mg via INTRAVENOUS
  Administered 2023-06-10: 20 mg via INTRAVENOUS

## 2023-06-10 MED ORDER — ONDANSETRON HCL 4 MG/2ML IJ SOLN: 4.0000 mg | Freq: Once | INTRAMUSCULAR | Status: DC | PRN

## 2023-06-10 MED ORDER — LIDOCAINE-EPINEPHRINE 1 %-1:100000 IJ SOLN
INTRAMUSCULAR | Status: DC | PRN
  Administered 2023-06-10: 10 mL via INTRADERMAL

## 2023-06-10 MED ORDER — CEFAZOLIN SODIUM-DEXTROSE 2-4 GM/100ML-% IV SOLN
2.0000 g | INTRAVENOUS | Status: AC
  Administered 2023-06-10: 2 g via INTRAVENOUS

## 2023-06-10 MED ORDER — KETOROLAC TROMETHAMINE 30 MG/ML IJ SOLN
INTRAMUSCULAR | Status: DC | PRN
  Administered 2023-06-10: 30 mg via INTRAVENOUS

## 2023-06-10 MED ORDER — AMISULPRIDE (ANTIEMETIC) 5 MG/2ML IV SOLN: 10.0000 mg | Freq: Once | INTRAVENOUS | Status: DC | PRN

## 2023-06-10 MED ORDER — KETOROLAC TROMETHAMINE 30 MG/ML IJ SOLN
INTRAMUSCULAR | Status: AC
  Filled 2023-06-10: qty 1

## 2023-06-10 MED ORDER — MIDAZOLAM HCL 2 MG/2ML IJ SOLN
INTRAMUSCULAR | Status: DC | PRN
  Administered 2023-06-10: 2 mg via INTRAVENOUS

## 2023-06-10 MED ORDER — IBUPROFEN 200 MG PO TABS: 600.0000 mg | ORAL_TABLET | Freq: Four times a day (QID) | ORAL | Status: AC

## 2023-06-10 MED ORDER — OXYCODONE HCL 5 MG PO TABS: 5.0000 mg | ORAL_TABLET | Freq: Four times a day (QID) | ORAL | 0 refills | Status: AC | PRN

## 2023-06-10 MED ORDER — ACETAMINOPHEN 500 MG PO TABS
1000.0000 mg | ORAL_TABLET | Freq: Once | ORAL | Status: AC
  Administered 2023-06-10: 1000 mg via ORAL

## 2023-06-10 MED ORDER — FENTANYL CITRATE (PF) 100 MCG/2ML IJ SOLN
INTRAMUSCULAR | Status: AC
Start: 2023-06-10 — End: ?
  Filled 2023-06-10: qty 2

## 2023-06-10 MED ORDER — MIDAZOLAM HCL 2 MG/2ML IJ SOLN
INTRAMUSCULAR | Status: AC
Start: 2023-06-10 — End: ?
  Filled 2023-06-10: qty 2

## 2023-06-10 MED ORDER — CEFAZOLIN SODIUM-DEXTROSE 2-4 GM/100ML-% IV SOLN
INTRAVENOUS | Status: AC
  Filled 2023-06-10: qty 100

## 2023-06-10 MED ORDER — ONDANSETRON HCL 4 MG/2ML IJ SOLN
INTRAMUSCULAR | Status: DC | PRN
  Administered 2023-06-10: 4 mg via INTRAVENOUS

## 2023-06-10 MED ORDER — ACETAMINOPHEN 500 MG PO TABS
ORAL_TABLET | ORAL | Status: AC
  Filled 2023-06-10: qty 2

## 2023-06-10 MED ORDER — SODIUM BICARBONATE 4.2 % IV SOLN
INTRAVENOUS | Status: DC | PRN
  Administered 2023-06-10: 2.5 meq via INTRAVENOUS

## 2023-06-10 SURGICAL SUPPLY — 34 items
APPLICATOR COTTON TIP 6 STRL (MISCELLANEOUS) ×1 IMPLANT
APPLICATOR COTTON TIP 6IN STRL (MISCELLANEOUS) ×1 IMPLANT
BLADE HOOK ENDO STRL (BLADE) ×1 IMPLANT
BLADE SURG 15 STRL LF DISP TIS (BLADE) ×1 IMPLANT
BLADE TRIANGLE EPF/EGR ENDO (BLADE) ×1 IMPLANT
BNDG COHESIVE 4X5 TAN STRL LF (GAUZE/BANDAGES/DRESSINGS) ×1 IMPLANT
BNDG ESMARK 4X9 LF (GAUZE/BANDAGES/DRESSINGS) ×1 IMPLANT
BNDG GAUZE DERMACEA FLUFF 4 (GAUZE/BANDAGES/DRESSINGS) ×1 IMPLANT
CHLORAPREP W/TINT 26 (MISCELLANEOUS) ×1 IMPLANT
CORD BIPOLAR FORCEPS 12FT (ELECTRODE) IMPLANT
COVER BACK TABLE 60X90IN (DRAPES) ×1 IMPLANT
COVER MAYO STAND STRL (DRAPES) ×1 IMPLANT
CUFF TOURN SGL QUICK 18X4 (TOURNIQUET CUFF) IMPLANT
DRAPE EXTREMITY T 121X128X90 (DISPOSABLE) ×1 IMPLANT
DRAPE SURG 17X23 STRL (DRAPES) ×1 IMPLANT
DRSG EMULSION OIL 3X3 NADH (GAUZE/BANDAGES/DRESSINGS) ×1 IMPLANT
GAUZE SPONGE 4X4 12PLY STRL LF (GAUZE/BANDAGES/DRESSINGS) ×1 IMPLANT
GLOVE BIO SURGEON STRL SZ7.5 (GLOVE) ×1 IMPLANT
GLOVE BIOGEL PI IND STRL 7.0 (GLOVE) ×1 IMPLANT
GLOVE BIOGEL PI IND STRL 8 (GLOVE) ×1 IMPLANT
GLOVE ECLIPSE 6.5 STRL STRAW (GLOVE) ×1 IMPLANT
GOWN STRL REUS W/ TWL LRG LVL3 (GOWN DISPOSABLE) ×2 IMPLANT
GOWN STRL REUS W/TWL XL LVL3 (GOWN DISPOSABLE) ×1 IMPLANT
NDL HYPO 25X1 1.5 SAFETY (NEEDLE) IMPLANT
NEEDLE HYPO 25X1 1.5 SAFETY (NEEDLE) IMPLANT
NS IRRIG 1000ML POUR BTL (IV SOLUTION) ×1 IMPLANT
PACK BASIN DAY SURGERY FS (CUSTOM PROCEDURE TRAY) ×1 IMPLANT
PADDING CAST ABS COTTON 4X4 ST (CAST SUPPLIES) IMPLANT
STOCKINETTE 6 STRL (DRAPES) ×1 IMPLANT
SUT VICRYL RAPIDE 4-0 (SUTURE) ×1 IMPLANT
SYR 10ML LL (SYRINGE) IMPLANT
SYR BULB EAR ULCER 3OZ GRN STR (SYRINGE) ×1 IMPLANT
TOWEL GREEN STERILE FF (TOWEL DISPOSABLE) ×2 IMPLANT
UNDERPAD 30X36 HEAVY ABSORB (UNDERPADS AND DIAPERS) ×1 IMPLANT

## 2023-06-10 NOTE — Op Note (Signed)
 06/10/2023  12:03 PM  PATIENT:  Colton Sanders  48 y.o. male  PRE-OPERATIVE DIAGNOSIS:  RIGHT CARPAL TUNNEL SYNDROME  POST-OPERATIVE DIAGNOSIS:  Same  PROCEDURE:  Procedure(s): RELEASE, CARPAL TUNNEL, ENDOSCOPIC, [phone]  SURGEON:  Surgeon(s): Rober Chimera, MD  PHYSICIAN ASSISTANT: Deadra Everts, OPA-C  ANESTHESIA:  Local / MAC  SPECIMENS:  None  DRAINS:   None  EBL: Less than 10 mL  PREOPERATIVE INDICATIONS:  Colton Sanders is a  48 y.o. male with a diagnosis of RIGHT CARPAL TUNNEL SYNDROME who failed conservative measures and elected for surgical management.    The risks benefits and alternatives were discussed with the patient preoperatively including but not limited to the risks of infection, bleeding, nerve injury, cardiopulmonary complications, the need for revision surgery, among others, and the patient verbalized understanding and consented to proceed.  OPERATIVE IMPLANTS: None  OPERATIVE FINDINGS: Tight carpal tunnel, acceptably decompressed following release  OPERATIVE PROCEDURE:  After receiving prophylactic antibiotics, the patient was escorted to the operative theatre and placed in a supine position.   A surgical "time-out" was performed during which the planned procedure, proposed operative site, and the correct patient identity were compared to the operative consent and agreement confirmed by the circulating nurse according to current facility policy.  Following application of a tourniquet to the operative extremity, the planned incisions were marked and anesthetized with buffered lidocaine  with epi.  The exposed skin was prepped with Chloraprep and draped in the usual sterile fashion.  The limb was exsanguinated with an Esmarch bandage and the tourniquet inflated to approximately higher than systolic BP.   The incisions were made sharply. Subcutaneous tissues were dissected with blunt and spreading dissection. At the proximal incision, the deep  forearm fascia was split in line with the skin incision the distal edge grasped with a hemostat. At the mid palmar incision, the palmar fascia was split in line with the skin incision, revealing the underlying superficial palmar arch which was visualized with loupe assisted magnification. The synovial reflector was then introduced into the proximal incision and passed through the carpal canal, uses to reflect synovium from the deep surface of the transverse carpal ligament. It was removed and replaced with the slotted cannula and blunt obturator, passing from proximal to distal and exiting the distal wound superficial to the superficial palmar arch. The obturator was removed and the camera inserted. Visualization of the ligament was acceptable. The triangle shape blade was then inserted distally, advanced to the midportion of the ligament, and there used to create a perforation in the ligament. This instrument was removed and the hooked nstrument was inserted. It was placed into the perforation in the ligament and withdrawn distally, completing transection of the distal half of the ligament. The camera was then removed and placed into the distal end of the cannula. The hooked instrument was placed into the proximal end and advanced facility to be placed into the apex of the V. which had been formed to the distal hemi-transection of the ligament. It was withdrawn proximally, completing transection of the ligament. The adequacy of the release was judged with the scope and the instruments used as a probe. All of the endoscopic instruments are removed and the adequacy of release was again judged from the proximal incision perspective with direct loupe assisted visualization. In addition, the proximal forearm fascia was split for 2 inches proximal to the proximal incision under direct visualization using a sliding scissor technique. The tourniquet was released, the wound copiously irrigated.  The skin was then closed  with 4-0 Vicryl Rapide interrupted sutures. A light dressing was applied and the patient was taken to the recovery room.  DISPOSITION:  The patient will be discharged home today, returning in 10-15 days for re-assessment.

## 2023-06-10 NOTE — Anesthesia Preprocedure Evaluation (Addendum)
 Anesthesia Evaluation  Patient identified by MRN, date of birth, ID band Patient awake    Reviewed: Allergy & Precautions, NPO status , Patient's Chart, lab work & pertinent test results  Airway Mallampati: I  TM Distance: >3 FB Neck ROM: Full    Dental  (+) Dental Advisory Given, Edentulous Upper, Edentulous Lower   Pulmonary Current Smoker, PE   Pulmonary exam normal breath sounds clear to auscultation       Cardiovascular negative cardio ROS Normal cardiovascular exam Rhythm:Regular Rate:Normal     Neuro/Psych  Headaches PSYCHIATRIC DISORDERS Anxiety Depression     RIGHT CARPAL TUNNEL SYNDROME  Neuromuscular disease    GI/Hepatic negative GI ROS, Neg liver ROS,,,  Endo/Other  negative endocrine ROS    Renal/GU negative Renal ROS     Musculoskeletal negative musculoskeletal ROS (+)    Abdominal   Peds  Hematology negative hematology ROS (+)   Anesthesia Other Findings Day of surgery medications reviewed with the patient.  Reproductive/Obstetrics                             Anesthesia Physical Anesthesia Plan  ASA: 2  Anesthesia Plan: MAC   Post-op Pain Management: Tylenol  PO (pre-op)*   Induction: Intravenous  PONV Risk Score and Plan: 0 and TIVA, Midazolam, Dexamethasone  and Ondansetron   Airway Management Planned: Natural Airway and Simple Face Mask  Additional Equipment:   Intra-op Plan:   Post-operative Plan:   Informed Consent: I have reviewed the patients History and Physical, chart, labs and discussed the procedure including the risks, benefits and alternatives for the proposed anesthesia with the patient or authorized representative who has indicated his/her understanding and acceptance.     Dental advisory given  Plan Discussed with: CRNA and Anesthesiologist  Anesthesia Plan Comments:        Anesthesia Quick Evaluation

## 2023-06-10 NOTE — Discharge Instructions (Addendum)
 Discharge Instructions   You have a light dressing on your hand.  You may begin gentle motion of your fingers and hand immediately, but you should not do any heavy lifting or gripping.  Elevate your hand to reduce pain & swelling of the digits.  Ice over the operative site may be helpful to reduce pain & swelling.  DO NOT USE HEAT. Pain medicine has been prescribed for you.  Take Tylenol  650 mg and Ibuprofen  600 mg every 6 hours together. Take Oxycodone  additionally as prescribed as a rescue medicine for severe post operative pain. Leave the dressing in place until the third day after your surgery and then remove it, leaving it open to air.  After the bandage has been removed you may shower, regularly washing the incision and letting the water run over it, but not submerging it (no swimming, soaking it in dishwater, etc.) You may drive a car when you are off of prescription pain medications and can safely control your vehicle with both hands. We will address whether therapy will be required or not when you return to the office. You may have already made your follow-up appointment when we completed your preop visit.  If not, please call our office today or the next business day to make your return appointment for 10-15 days after surgery.   Please call [phone] during normal business hours or [phone] after hours for any problems. Including the following:  - excessive redness of the incisions - drainage for more than 4 days - fever of more than 101.5 F  *Please note that pain medications will not be refilled after hours or on weekends.   WORK NOTE: Patient will be out of work from 06/10/23-06/14/23. He may return to work on 06/15/23.   Post Anesthesia Home Care Instructions  Activity: Get plenty of rest for the remainder of the day. A responsible individual must stay with you for 24 hours following the procedure.  For the next 24 hours, DO NOT: -Drive a car -Advertising copywriter -Drink  alcoholic beverages -Take any medication unless instructed by your physician -Make any legal decisions or sign important papers.  Meals: Start with liquid foods such as gelatin or soup. Progress to regular foods as tolerated. Avoid greasy, spicy, heavy foods. If nausea and/or vomiting occur, drink only clear liquids until the nausea and/or vomiting subsides. Call your physician if vomiting continues.  Special Instructions/Symptoms: Your throat may feel dry or sore from the anesthesia or the breathing tube placed in your throat during surgery. If this causes discomfort, gargle with warm salt water. The discomfort should disappear within 24 hours.  If you had a scopolamine patch placed behind your ear for the management of post- operative nausea and/or vomiting:  1. The medication in the patch is effective for 72 hours, after which it should be removed.  Wrap patch in a tissue and discard in the trash. Wash hands thoroughly with soap and water. 2. You may remove the patch earlier than 72 hours if you experience unpleasant side effects which may include dry mouth, dizziness or visual disturbances. 3. Avoid touching the patch. Wash your hands with soap and water after contact with the patch.     No ibuprofen /NSAIDS until after 11pm No tylenol  until after 610pm

## 2023-06-10 NOTE — Transfer of Care (Signed)
 Immediate Anesthesia Transfer of Care Note  Patient: Colton Sanders  Procedure(s) Performed: RELEASE, CARPAL TUNNEL, ENDOSCOPIC (Right)  Patient Location: PACU  Anesthesia Type:General  Level of Consciousness: awake, alert , oriented, and patient cooperative  Airway & Oxygen Therapy: Patient Spontanous Breathing and Patient connected to nasal cannula oxygen  Post-op Assessment: Report given to RN and Post -op Vital signs reviewed and stable  Post vital signs: Reviewed and stable  Last Vitals:  Vitals Value Taken Time  BP 136/83 06/10/23 1500  Temp 36.2 C 06/10/23 1456  Pulse 67 06/10/23 1501  Resp 20 06/10/23 1501  SpO2 100 % 06/10/23 1501  Vitals shown include unfiled device data.  Last Pain:  Vitals:   06/10/23 1456  TempSrc:   PainSc: Asleep         Complications: No notable events documented.

## 2023-06-10 NOTE — Interval H&P Note (Signed)
 History and Physical Interval Note:  06/10/2023 12:02 PM  Colton Sanders  has presented today for surgery, with the diagnosis of RIGHT CARPAL TUNNEL SYNDROME.  The various methods of treatment have been discussed with the patient and family. After consideration of risks, benefits and other options for treatment, the patient has consented to  Procedure(s): RELEASE, CARPAL TUNNEL, ENDOSCOPIC (Right) as a surgical intervention.  The patient's history has been reviewed, patient examined, no change in status, stable for surgery.  I have reviewed the patient's chart and labs.  Questions were answered to the patient's satisfaction.     Sheryl Donna

## 2023-06-10 NOTE — Anesthesia Postprocedure Evaluation (Signed)
 Anesthesia Post Note  Patient: LEAN FAYSON  Procedure(s) Performed: RELEASE, CARPAL TUNNEL, ENDOSCOPIC (Right)     Patient location during evaluation: PACU Anesthesia Type: MAC Level of consciousness: awake and alert Pain management: pain level controlled Vital Signs Assessment: post-procedure vital signs reviewed and stable Respiratory status: spontaneous breathing, nonlabored ventilation and respiratory function stable Cardiovascular status: stable and blood pressure returned to baseline Postop Assessment: no apparent nausea or vomiting Anesthetic complications: no   No notable events documented.  Last Vitals:  Vitals:   06/10/23 1515 06/10/23 1540  BP: 138/81 137/85  Pulse: 69 71  Resp: (!) 21 16  Temp:  36.5 C  SpO2: 96% 93%    Last Pain:  Vitals:   06/10/23 1540  TempSrc: Temporal  PainSc: 0-No pain                 Erin Havers

## 2023-06-10 NOTE — Anesthesia Procedure Notes (Signed)
 Procedure Name: MAC Date/Time: 06/10/2023 2:24 PM  Performed by: Lonia Ro, CRNAPre-anesthesia Checklist: Patient identified, Emergency Drugs available, Suction available and Patient being monitored Patient Re-evaluated:Patient Re-evaluated prior to induction Oxygen Delivery Method: Simple face mask Placement Confirmation: positive ETCO2

## 2023-06-11 ENCOUNTER — Encounter (HOSPITAL_BASED_OUTPATIENT_CLINIC_OR_DEPARTMENT_OTHER): Payer: Self-pay | Admitting: Orthopedic Surgery
